# Patient Record
Sex: Female | Born: 1975 | Race: White | Hispanic: No | Marital: Married | Smoking: Current every day smoker
Health system: Southern US, Community
[De-identification: ages and names within clinical notes are randomized; demographics above are authoritative.]

## PROBLEM LIST (undated history)

## (undated) DIAGNOSIS — N7011 Chronic salpingitis: Secondary | ICD-10-CM

## (undated) DIAGNOSIS — N200 Calculus of kidney: Secondary | ICD-10-CM

## (undated) DIAGNOSIS — M549 Dorsalgia, unspecified: Secondary | ICD-10-CM

## (undated) DIAGNOSIS — K219 Gastro-esophageal reflux disease without esophagitis: Secondary | ICD-10-CM

## (undated) DIAGNOSIS — N83201 Unspecified ovarian cyst, right side: Secondary | ICD-10-CM

## (undated) DIAGNOSIS — F419 Anxiety disorder, unspecified: Secondary | ICD-10-CM

## (undated) DIAGNOSIS — A281 Cat-scratch disease: Secondary | ICD-10-CM

## (undated) DIAGNOSIS — IMO0001 Reserved for inherently not codable concepts without codable children: Secondary | ICD-10-CM

## (undated) DIAGNOSIS — F99 Mental disorder, not otherwise specified: Secondary | ICD-10-CM

## (undated) DIAGNOSIS — I10 Essential (primary) hypertension: Secondary | ICD-10-CM

## (undated) DIAGNOSIS — N979 Female infertility, unspecified: Secondary | ICD-10-CM

## (undated) HISTORY — DX: Chronic salpingitis: N70.11

## (undated) HISTORY — PX: ABDOMINAL SURGERY: SHX537

## (undated) HISTORY — DX: Female infertility, unspecified: N97.9

## (undated) HISTORY — PX: LITHOTRIPSY: SUR834

## (undated) HISTORY — DX: Mental disorder, not otherwise specified: F99

## (undated) HISTORY — DX: Unspecified ovarian cyst, right side: N83.201

---

## 2014-09-05 ENCOUNTER — Encounter (HOSPITAL_COMMUNITY): Payer: Self-pay | Admitting: *Deleted

## 2014-09-05 ENCOUNTER — Emergency Department (HOSPITAL_COMMUNITY): Payer: Commercial Managed Care - PPO

## 2014-09-05 ENCOUNTER — Emergency Department (HOSPITAL_COMMUNITY)
Admission: EM | Admit: 2014-09-05 | Discharge: 2014-09-05 | Disposition: A | Discharge status: Home / Self Care | Payer: Commercial Managed Care - PPO | Attending: Emergency Medicine | Admitting: Emergency Medicine

## 2014-09-05 DIAGNOSIS — Z87442 Personal history of urinary calculi: Secondary | ICD-10-CM | POA: Diagnosis not present

## 2014-09-05 DIAGNOSIS — R51 Headache: Secondary | ICD-10-CM | POA: Diagnosis not present

## 2014-09-05 DIAGNOSIS — R072 Precordial pain: Secondary | ICD-10-CM | POA: Insufficient documentation

## 2014-09-05 DIAGNOSIS — Z79899 Other long term (current) drug therapy: Secondary | ICD-10-CM | POA: Insufficient documentation

## 2014-09-05 DIAGNOSIS — H538 Other visual disturbances: Secondary | ICD-10-CM | POA: Diagnosis not present

## 2014-09-05 DIAGNOSIS — F419 Anxiety disorder, unspecified: Secondary | ICD-10-CM | POA: Diagnosis not present

## 2014-09-05 DIAGNOSIS — R110 Nausea: Secondary | ICD-10-CM | POA: Diagnosis not present

## 2014-09-05 DIAGNOSIS — R109 Unspecified abdominal pain: Secondary | ICD-10-CM | POA: Insufficient documentation

## 2014-09-05 DIAGNOSIS — Z791 Long term (current) use of non-steroidal anti-inflammatories (NSAID): Secondary | ICD-10-CM | POA: Insufficient documentation

## 2014-09-05 DIAGNOSIS — R200 Anesthesia of skin: Secondary | ICD-10-CM | POA: Insufficient documentation

## 2014-09-05 DIAGNOSIS — R079 Chest pain, unspecified: Secondary | ICD-10-CM | POA: Diagnosis present

## 2014-09-05 DIAGNOSIS — I10 Essential (primary) hypertension: Secondary | ICD-10-CM | POA: Insufficient documentation

## 2014-09-05 DIAGNOSIS — Z720 Tobacco use: Secondary | ICD-10-CM | POA: Insufficient documentation

## 2014-09-05 DIAGNOSIS — M549 Dorsalgia, unspecified: Secondary | ICD-10-CM | POA: Diagnosis not present

## 2014-09-05 DIAGNOSIS — K219 Gastro-esophageal reflux disease without esophagitis: Secondary | ICD-10-CM | POA: Insufficient documentation

## 2014-09-05 DIAGNOSIS — M7989 Other specified soft tissue disorders: Secondary | ICD-10-CM | POA: Diagnosis not present

## 2014-09-05 DIAGNOSIS — H53149 Visual discomfort, unspecified: Secondary | ICD-10-CM | POA: Diagnosis not present

## 2014-09-05 DIAGNOSIS — R0602 Shortness of breath: Secondary | ICD-10-CM | POA: Diagnosis not present

## 2014-09-05 DIAGNOSIS — Z8619 Personal history of other infectious and parasitic diseases: Secondary | ICD-10-CM | POA: Insufficient documentation

## 2014-09-05 DIAGNOSIS — R519 Headache, unspecified: Secondary | ICD-10-CM

## 2014-09-05 HISTORY — DX: Cat-scratch disease: A28.1

## 2014-09-05 HISTORY — DX: Gastro-esophageal reflux disease without esophagitis: K21.9

## 2014-09-05 HISTORY — DX: Calculus of kidney: N20.0

## 2014-09-05 HISTORY — DX: Dorsalgia, unspecified: M54.9

## 2014-09-05 HISTORY — DX: Essential (primary) hypertension: I10

## 2014-09-05 HISTORY — DX: Anxiety disorder, unspecified: F41.9

## 2014-09-05 HISTORY — DX: Reserved for inherently not codable concepts without codable children: IMO0001

## 2014-09-05 LAB — COMPREHENSIVE METABOLIC PANEL
ALBUMIN: 3.7 g/dL (ref 3.5–5.2)
ALK PHOS: 63 U/L (ref 39–117)
ALT: 14 U/L (ref 0–35)
AST: 19 U/L (ref 0–37)
Anion gap: 7 (ref 5–15)
BUN: 12 mg/dL (ref 6–23)
CO2: 20 mmol/L (ref 19–32)
Calcium: 8.8 mg/dL (ref 8.4–10.5)
Chloride: 111 mmol/L (ref 96–112)
Creatinine, Ser: 0.87 mg/dL (ref 0.50–1.10)
GFR calc non Af Amer: 82 mL/min — ABNORMAL LOW (ref >90)
GLUCOSE: 116 mg/dL — AB (ref 70–99)
POTASSIUM: 3.3 mmol/L — AB (ref 3.5–5.1)
SODIUM: 138 mmol/L (ref 135–145)
Total Bilirubin: 0.2 mg/dL — ABNORMAL LOW (ref 0.3–1.2)
Total Protein: 7 g/dL (ref 6.0–8.3)

## 2014-09-05 LAB — CBC WITH DIFFERENTIAL/PLATELET
Basophils Absolute: 0 10*3/uL (ref 0.0–0.1)
Basophils Relative: 0 % (ref 0–1)
EOS ABS: 0.1 10*3/uL (ref 0.0–0.7)
EOS PCT: 1 % (ref 0–5)
HCT: 37.8 % (ref 36.0–46.0)
Hemoglobin: 12.8 g/dL (ref 12.0–15.0)
Lymphocytes Relative: 31 % (ref 12–46)
Lymphs Abs: 3.4 10*3/uL (ref 0.7–4.0)
MCH: 28.6 pg (ref 26.0–34.0)
MCHC: 33.9 g/dL (ref 30.0–36.0)
MCV: 84.4 fL (ref 78.0–100.0)
MONO ABS: 0.6 10*3/uL (ref 0.1–1.0)
Monocytes Relative: 6 % (ref 3–12)
Neutro Abs: 6.8 10*3/uL (ref 1.7–7.7)
Neutrophils Relative %: 62 % (ref 43–77)
PLATELETS: 392 10*3/uL (ref 150–400)
RBC: 4.48 MIL/uL (ref 3.87–5.11)
RDW: 13.7 % (ref 11.5–15.5)
WBC: 10.9 10*3/uL — ABNORMAL HIGH (ref 4.0–10.5)

## 2014-09-05 LAB — TROPONIN I
Troponin I: <0.03 ng/mL (ref <0.031)
Troponin I: <0.03 ng/mL (ref <0.031)

## 2014-09-05 LAB — D-DIMER, QUANTITATIVE: D-Dimer, Quant: 0.27 ug/mL-FEU (ref 0.00–0.48)

## 2014-09-05 MED ORDER — SODIUM CHLORIDE 0.9 % IV BOLUS (SEPSIS)
500.0000 mL | Freq: Once | INTRAVENOUS | Status: AC
  Administered 2014-09-05: 500 mL via INTRAVENOUS

## 2014-09-05 MED ORDER — HYDROCODONE-ACETAMINOPHEN 5-325 MG PO TABS: 1.0000 | ORAL_TABLET | Freq: Four times a day (QID) | ORAL | Status: DC | PRN

## 2014-09-05 MED ORDER — ASPIRIN 81 MG PO CHEW
324.0000 mg | CHEWABLE_TABLET | Freq: Once | ORAL | Status: AC
  Administered 2014-09-05: 324 mg via ORAL
  Filled 2014-09-05: qty 4

## 2014-09-05 MED ORDER — ONDANSETRON HCL 4 MG/2ML IJ SOLN
4.0000 mg | Freq: Once | INTRAMUSCULAR | Status: AC
  Administered 2014-09-05: 4 mg via INTRAVENOUS
  Filled 2014-09-05: qty 2

## 2014-09-05 MED ORDER — SODIUM CHLORIDE 0.9 % IV SOLN
INTRAVENOUS | Status: DC
  Administered 2014-09-05: 20:00:00 via INTRAVENOUS

## 2014-09-05 MED ORDER — HYDROMORPHONE HCL 1 MG/ML IJ SOLN
1.0000 mg | Freq: Once | INTRAMUSCULAR | Status: AC
  Administered 2014-09-05: 1 mg via INTRAVENOUS
  Filled 2014-09-05: qty 1

## 2014-09-05 NOTE — ED Notes (Signed)
Chest pain, with pain lt arm , headache, numbness lt side of face at times.  Onset this am.

## 2014-09-05 NOTE — Discharge Instructions (Signed)
Workup for the headache flank pain and chest pain all negative. Return for any new or worse symptoms. Take pain medicine as directed. Resource guide provided below as needed.   Emergency Department Resource Guide 1) Find a Doctor and Pay Out of Pocket Although you won't have to find out who is covered by your insurance plan, it is a good idea to ask around and get recommendations. You will then need to call the office and see if the doctor you have chosen will accept you as a new patient and what types of options they offer for patients who are self-pay. Some doctors offer discounts or will set up payment plans for their patients who do not have insurance, but you will need to ask so you aren't surprised when you get to your appointment.  2) Contact Your Local Health Department Not all health departments have doctors that can see patients for sick visits, but many do, so it is worth a call to see if yours does. If you don't know where your local health department is, you can check in your phone book. The CDC also has a tool to help you locate your state's health department, and many state websites also have listings of all of their local health departments.  3) Find a [facility] If your illness is not likely to be very severe or complicated, you may want to try a walk in clinic. These are popping up all over the country in pharmacies, drugstores, and shopping centers. They're usually staffed by nurse practitioners or physician assistants that have been trained to treat common illnesses and complaints. They're usually fairly quick and inexpensive. However, if you have serious medical issues or chronic medical problems, these are probably not your best option.  No Primary Care Doctor: - Call Health Connect at  [phone] - they can help you locate a primary care doctor that  accepts your insurance, provides certain services, etc. - Physician Referral Service- [phone]  Chronic Pain  Problems: Organization         Address  Phone   Notes  [facility]  [phone] Patients need to be referred by their primary care doctor.   Medication Assistance: Organization         Address  Phone   Notes  [facility] Medication [facility] [address] [place], [place] [address] [phone] --Must be a resident of [facility] -- Must have NO insurance coverage whatsoever (no Medicaid/ Medicare, etc.) -- The pt. MUST have a primary care doctor that directs their care regularly and follows them in the community   MedAssist  [phone]   [employer]  [phone]    Agencies that provide inexpensive medical care: Organization         Address  Phone   Notes  [place]  [phone]   [facility]    [phone]   [facility] [place], [place] [address] [phone]   [place] [address] [place] [phone]   Planned Parenthood    [phone]   [facility]    [phone]   [place] and [place], [place] Phone:  [phone], Fax:  [phone] Hours of Operation:  9 am - 6 pm, M-F.  Also accepts Medicaid/Medicare and self-pay.  [facility] [address]  [place], [place], [place] Phone: [phone], Fax: [phone]. Hours of Operation:  8:30 am - 5:30 pm, M-F.  Also accepts Medicaid and self-pay.  [facility] [address], [place] Phone: [phone]   [place], [place], [place] [phone], Ext. [phone] Mondays & Thursdays: 7-9 AM.  First 15 patients are seen on a first come, first serve basis.    [place] Providers:  Organization         Address  Phone   Notes  [facility] [address], [place] [phone] Also  accepts self-pay patients.  [facility] [address], [place]  [phone]   [place], [address], [place] [phone]   [facility] [address] [place] [phone]   Lucianne Lei [address], [place]   [phone] Only accepts [place] Access [place] patients after they have their name applied to their card.   Self-Pay (no insurance) in [facility]:  Organization         Address  Phone   Notes  Sickle Cell Patients, [facility] [place] [phone]   [facility] [place] [phone]   [facility] [place]  [place], [place],  [phone]   [facility]/Dr. Osei-Bonsu  [address] [place] or [address], [place] [phone] Phone number for both [place] and [place] locations is the same.  Urgent Medical and [facility] [address], [place] [phone]   [facility] [address] [place] or [address] [phone]   [facility] [address], [place] [phone], phone; [phone], fax Sees patients 1st and 3rd Saturday of every month.  Must not qualify for public or private insurance (i.e. Medicaid, Medicare, [place] Health Choice, Veterans' Benefits)  Household income should be no more than 200% of the poverty level The clinic cannot treat you if you are pregnant or think you are pregnant  Sexually transmitted diseases are not treated at the clinic.    Dental Care: Organization         Address  Phone  Notes  [facility] Department of [facility] [place] [phone] Accepts children up to age 34 who are enrolled in [place] or [place]; pregnant  women with a Medicaid card; and children who have applied for Medicaid or [place] Health Choice, but were declined, whose parents can pay a reduced fee at time of service.  [facility] Department of [facility]  [address], [place] [phone] Accepts children up to age 24 who are enrolled in [place] or [place]; pregnant women with a Medicaid card; and children who have applied for Medicaid or [place] Health Choice, but were declined, whose parents can pay a reduced fee at time of service.  [facility] Access PROGRAM  [place] [phone] Patients are seen by appointment only. Walk-ins are not accepted. [place] will see patients 57 years of age and older. Monday - Tuesday (8am-5pm) Most Wednesdays (8:30-5pm) $30 per visit, cash only  [place]  [address], [place] [phone] Patients are seen by appointment only. Walk-ins are not accepted. [place] will see patients 47 years of age and older. One Wednesday Evening (Monthly: Volunteer Based).  $30 per visit, cash only  [place]  [phone] for adults; Children under age 20, call Graduate Pediatric Dentistry at [phone]. Children aged 75-14, please call [phone] to request a pediatric application.  Dental services are provided in all areas of dental care including fillings, crowns and bridges, complete and partial dentures, implants, gum treatment, root canals, and extractions. Preventive care is also provided. Treatment is provided to both adults and children. Patients are selected via a lottery and there is often a waiting list.   [facility] [address], [place]  [phone] [email or web address]   [facility] [address] [place], [place] [phone], Ext. [phone] Second and Fourth Thursday of each month, opens at 6:30 AM; Clinic ends at 9 AM.  Patients are  seen on a first-come first-served basis, and a limited number are seen during each clinic.   [facility]  [address] Hillard Danker [place], [place] [phone]   Eligibility Requirements You must have lived in [place], [place], or [place] counties for at least the last three months.   You cannot be eligible for state or federal sponsored [employer], including [employer], [place], or [employer].   You generally cannot be eligible for healthcare insurance through your employer.    How to apply: Eligibility screenings are held every Tuesday and Wednesday afternoon from 1:00 pm until 4:00 pm. You do not need an appointment for the interview!  [facility] [address] [place], [place]   [place]  [place] Department  [place]  [phone]    Behavioral Health Resources in the Community: Intensive Outpatient Programs Organization         Address  Phone  Notes  [place]. [address] [place], [place] [phone]   [facility] [address] [place], [place]   ADS: [facility] [address] [place], [place]   [place] [address]  [place], [place] or [phone]   Substance Abuse Resources Organization         Address  Phone  Notes  Alcohol and Drug Services  [phone]   [place]  [phone]   [facility] [place]   Chinita Pester  [phone]   Residential & Outpatient Substance Abuse Program  [phone]   Psychological Services Organization         Address  Phone  Notes  [facility] [place]  [place]  [phone]   [place] [address] [place] or [phone]    Mobile Crisis  Teams Organization         Address  Phone  Notes  [facility], Mobile Crisis Care Unit  [phone]   [facility]  [address] [place], [place]   [place] [address] [place] [phone]    Self-Help/Support Groups Organization         Address  Phone             Notes  [place]. of [place] -  variety of support groups  [phone]- [phone] Call for more information  Narcotics Anonymous (NA), [facility] [address], [employer] [place]  2 meetings at this location   Residential Facilities manager         Address  Phone  Notes  [facility] [place],    [place]  [phone]   [facility]  [address] [place] 623762, [place], [place]   [place], [place] [phone] Admissions: 8am-3pm M-F  Incentives Substance [place] [address],    [place], [place] [phone]   [facility] [address] [place], [place], [place]   [facility] [address]  [place], [place]   [facility]ight Programs - Intensive Outpatient [address]een [address], [place], [place]   [facility] [place] ([place] [address].,  [place], [place] [phone] or [phone]   [facility] Treatment Services (RTS) [address] [place], [place] Accepts Medicaid  [facility] [place] [address],  [place] [phone] Substance Abuse/Addiction Treatment   [facility] Organization         Address  Phone  Notes  [facility]  [phone]   Domenic Schwab, PhD [address] [place], [place]   [phone] or [phone]   [place], [place] [phone]   [facility] [address] [place], [place] [phone]  Insurance/Medicaid/sponsorship through [employer] [facility] [employer] [facility] [employer] and Families [address]                                    [place], [place] [phone] [place] [address], [place] [phone]    Dr. Adele Schilder  [phone]   [facility] Dept. 1) [address] [place] 2) [place] 3)  [place] [address], [place] [phone]  [phone]   [place] [phone] or [phone] (After Hours)

## 2014-09-05 NOTE — ED Notes (Addendum)
Called pt x4 times for room. No answer in waiting room. Charge RN aware.

## 2014-09-05 NOTE — ED Notes (Signed)
Pt requesting something to eat and drink.

## 2014-09-05 NOTE — ED Provider Notes (Signed)
CSN: [id number]     Arrival date & time 09/05/14  1319 History  This chart was scribed for Megan Sorrow, MD by Molli Posey, ED Scribe. This patient was seen in room APA06/APA06 and the patient's care was started 7:27 PM.    Chief Complaint  Patient presents with  . Chest Pain   Patient is a 39 y.o. female presenting with chest pain. The history is provided by the patient. No language interpreter was used.  Chest Pain Pain location:  Substernal area Pain radiates to:  L arm Pain radiates to the back: yes   Pain severity:  Moderate Onset quality:  Sudden Duration:  9 hours Progression:  Waxing and waning Chronicity:  New Worsened by:  Exertion Associated symptoms: back pain, dizziness, headache, nausea, numbness and shortness of breath   Associated symptoms: no abdominal pain, no cough, no fever and not vomiting    HPI Comments: Megan Sweeney is a 39 y.o. female with a history of kindey stones, HTN and anxiety who presents to the Emergency Department complaining of substernal, chest pain that started at 11AM this morning. Pt rates her CP as a 8/10 at this time. Pt states that the pain radiates to her left arm and she is experiencing a tingling sensation on the left side of her face. She reports that her pain worsens with walking and reports associated SOB, nausea and HA. She states that her worst pain is her HA currently. Pt reports no similar prior episodes. She reports that her mother died at age 25 due to a heart problem. She denies body aches.   Pt also complains of left sided back pain and says she thinks that she has a kidney stone.   Past Medical History  Diagnosis Date  . Kidney stones   . Hypertension   . Anxiety   . Reflux   . Back pain   . Cat scratch fever    Past Surgical History  Procedure Laterality Date  . Lithotripsy    . Abdominal surgery     History reviewed. No pertinent family history. History  Substance Use Topics  . Smoking status: Current  Every Day Smoker  . Smokeless tobacco: Not on file  . Alcohol Use: No   OB History    No data available     Review of Systems  Constitutional: Negative for fever and chills.  HENT: Negative for rhinorrhea and sore throat.   Eyes: Positive for photophobia and visual disturbance.  Respiratory: Positive for shortness of breath. Negative for cough.   Cardiovascular: Positive for chest pain and leg swelling.  Gastrointestinal: Positive for nausea. Negative for vomiting, abdominal pain and diarrhea.  Genitourinary: Negative for dysuria.  Musculoskeletal: Positive for back pain. Negative for myalgias and neck pain.  Skin: Negative for rash.  Neurological: Positive for dizziness, light-headedness, numbness and headaches.  Hematological: Does not bruise/bleed easily.  Psychiatric/Behavioral: Negative for confusion.      Allergies  Sulfa antibiotics  Home Medications   Prior to Admission medications   Medication Sig Start Date End Date Taking? Authorizing Provider  acetaminophen (TYLENOL) 500 MG tablet Take 500 mg by mouth every 4 (four) hours as needed for mild pain or moderate pain.   Yes Historical Provider, MD  atenolol (TENORMIN) 25 MG tablet Take 25 mg by mouth daily.   Yes Historical Provider, MD  buPROPion (WELLBUTRIN XL) 150 MG 24 hr tablet Take 150 mg by mouth daily.   Yes Historical Provider, MD  escitalopram (LEXAPRO) 20  MG tablet Take 20 mg by mouth daily.   Yes Historical Provider, MD  lisinopril (PRINIVIL,ZESTRIL) 10 MG tablet Take 10 mg by mouth daily.   Yes Historical Provider, MD  naproxen sodium (ANAPROX) 220 MG tablet Take 220 mg by mouth daily as needed (for pain).   Yes Historical Provider, MD  Omega-3 Fatty Acids (FISH OIL BURP-LESS PO) Take 1 capsule by mouth daily.   Yes Historical Provider, MD  pantoprazole (PROTONIX) 40 MG tablet Take 40 mg by mouth daily.   Yes Historical Provider, MD  simvastatin (ZOCOR) 20 MG tablet Take 20 mg by mouth daily.   Yes  Historical Provider, MD  fluconazole (DIFLUCAN) 150 MG tablet Take 150 mg by mouth once. Starting on 08/25/14 take one tablet, then repeat in 3 days    Historical Provider, MD  HYDROcodone-acetaminophen (NORCO/VICODIN) 5-325 MG per tablet Take 1-2 tablets by mouth every 6 (six) hours as needed. 09/05/14   Megan Sorrow, MD   BP 123/98 mmHg  Pulse 78  Temp(Src) 98.4 F (36.9 C) (Oral)  Resp 20  Ht 5\' 10"  (1.778 m)  Wt 248 lb (112.492 kg)  BMI 35.58 kg/m2  SpO2 98%  LMP 09/02/2014 Physical Exam  Constitutional: She is oriented to person, place, and time. She appears well-developed and well-nourished.  HENT:  Head: Normocephalic and atraumatic.  Mouth/Throat: Oropharynx is clear and moist.  Eyes: EOM are normal. Pupils are equal, round, and reactive to light. Right eye exhibits no discharge. Left eye exhibits no discharge. No scleral icterus.  Neck: Neck supple. No tracheal deviation present.  Cardiovascular: Normal rate, regular rhythm and normal heart sounds.   Pulmonary/Chest: Effort normal and breath sounds normal. No respiratory distress. She has no wheezes. She has no rales.  Abdominal: Soft. Bowel sounds are normal. She exhibits no distension. There is no tenderness.  Musculoskeletal:  No swelling in the ankles.   Neurological: She is alert and oriented to person, place, and time.  Skin: Skin is warm and dry.  Psychiatric: She has a normal mood and affect. Her behavior is normal.  Nursing note and vitals reviewed.   ED Course  Procedures   DIAGNOSTIC STUDIES: Oxygen Saturation is 97% on RA, normal by my interpretation.    COORDINATION OF CARE: 7:36 PM Discussed treatment plan with pt at bedside and pt agreed to plan.   Labs Review Labs Reviewed  CBC WITH DIFFERENTIAL/PLATELET - Abnormal; Notable for the following:    WBC 10.9 (*)    All other components within normal limits  COMPREHENSIVE METABOLIC PANEL - Abnormal; Notable for the following:    Potassium 3.3 (*)     Glucose, Bld 116 (*)    Total Bilirubin 0.2 (*)    GFR calc non Af Amer 82 (*)    All other components within normal limits  TROPONIN I  D-DIMER, QUANTITATIVE  TROPONIN I    Imaging Review Dg Chest 2 View  09/05/2014   CLINICAL DATA:  Chest pain, nausea, left face tingling  EXAM: CHEST  2 VIEW  COMPARISON:  None.  FINDINGS: Cardiomediastinal silhouette is unremarkable. No acute infiltrate or pleural effusion. No pulmonary edema. Bony thorax is unremarkable.  IMPRESSION: No active cardiopulmonary disease.   Electronically Signed   By: Lahoma Crocker M.D.   On: 09/05/2014 14:33   Ct Head Wo Contrast  09/05/2014   CLINICAL DATA:  Headaches today.  Tingling to the left side of face.  EXAM: CT HEAD WITHOUT CONTRAST  TECHNIQUE: Contiguous axial images were  obtained from the base of the skull through the vertex without intravenous contrast.  COMPARISON:  None.  FINDINGS: There is no midline shift or hydrocephalus. No acute hemorrhage or acute transcortical infarct is identified. 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 The bony calvarium is intact. The visualized sinuses are clear.  IMPRESSION: No acute abnormality identified.   Electronically Signed   By: Abelardo Diesel M.D.   On: 09/05/2014 20:37   Ct Renal Stone Study  09/05/2014   CLINICAL DATA:  Bilateral flank pain and history of renal calculi.  EXAM: CT ABDOMEN AND PELVIS WITHOUT CONTRAST  TECHNIQUE: Multidetector CT imaging of the abdomen and pelvis was performed following the standard protocol without IV contrast.  COMPARISON:  None.  FINDINGS: Nonobstructing calculus in the lower pole of the left kidney measures approximately 6 mm in greatest diameter. There may be 1 or 2 other punctate calculi in the midportion of the left kidney. Punctate calculus present in the mid right kidney. No hydronephrosis bilaterally. No ureteral or bladder calculi identified.  Unenhanced appearance of the liver, gallbladder, pancreas,  spleen, adrenal glands and bowel are unremarkable. No abnormal fluid collections. No hernias, masses or enlarged lymph nodes are seen.  The uterus and adnexal regions are unremarkable by CT. No bony abnormalities are seen. Incidental transitional S1 level.  IMPRESSION: 6 mm lower pole calculus of the left kidney and other bilateral punctate calculi. No evidence of hydronephrosis. No ureteral or bladder calculi identified.   Electronically Signed   By: Aletta Edouard M.D.   On: 09/05/2014 20:35     EKG Interpretation   Date/Time:  Monday September 05 2014 13:24:46 EST Ventricular Rate:  89 PR Interval:  164 QRS Duration: 76 QT Interval:  362 QTC Calculation: 440 R Axis:   62 Text Interpretation:  Normal sinus rhythm Normal ECG No previous ECGs  available Confirmed by Harvey Matlack  MD, Tashiba Timoney ([id number]) on 09/05/2014 7:26:06 PM      MDM   Final diagnoses:  Headache  Flank pain  Chest pain, unspecified chest pain type   Patient with onset of multiple symptoms at 3:00 in the afternoon. Included shortness of breath substernal chest pain headache left-sided flank pain. May very well be viral cause. Workup for chest pain negative troponins 2. Unlikely to be cardiac EKG without acute changes. CT of the abdomen shows no evidence of any ureteral stones or abnormalities. CT of head was negative chest x-ray negative for pneumonia or pneumothorax. Suspect symptoms may be a viral illness. Patient improved here with pain medicine. Will discharge home with close follow-up and precautions.  I personally performed the services described in this documentation, which was scribed in my presence. The recorded information has been reviewed and is accurate.       Megan Sorrow, MD 09/05/14 2248

## 2015-05-22 ENCOUNTER — Encounter (HOSPITAL_COMMUNITY): Payer: Self-pay | Admitting: Emergency Medicine

## 2015-05-22 ENCOUNTER — Emergency Department (HOSPITAL_COMMUNITY)
Admission: EM | Admit: 2015-05-22 | Discharge: 2015-05-22 | Disposition: A | Discharge status: Home / Self Care | Payer: Commercial Managed Care - PPO | Attending: Emergency Medicine | Admitting: Emergency Medicine

## 2015-05-22 ENCOUNTER — Emergency Department (HOSPITAL_COMMUNITY): Payer: Commercial Managed Care - PPO

## 2015-05-22 DIAGNOSIS — F419 Anxiety disorder, unspecified: Secondary | ICD-10-CM | POA: Diagnosis not present

## 2015-05-22 DIAGNOSIS — M545 Low back pain, unspecified: Secondary | ICD-10-CM

## 2015-05-22 DIAGNOSIS — I10 Essential (primary) hypertension: Secondary | ICD-10-CM | POA: Diagnosis not present

## 2015-05-22 DIAGNOSIS — F17210 Nicotine dependence, cigarettes, uncomplicated: Secondary | ICD-10-CM | POA: Insufficient documentation

## 2015-05-22 DIAGNOSIS — R112 Nausea with vomiting, unspecified: Secondary | ICD-10-CM | POA: Diagnosis not present

## 2015-05-22 DIAGNOSIS — Z87442 Personal history of urinary calculi: Secondary | ICD-10-CM | POA: Insufficient documentation

## 2015-05-22 DIAGNOSIS — K219 Gastro-esophageal reflux disease without esophagitis: Secondary | ICD-10-CM | POA: Diagnosis not present

## 2015-05-22 DIAGNOSIS — Z79899 Other long term (current) drug therapy: Secondary | ICD-10-CM | POA: Insufficient documentation

## 2015-05-22 DIAGNOSIS — R109 Unspecified abdominal pain: Secondary | ICD-10-CM | POA: Diagnosis not present

## 2015-05-22 DIAGNOSIS — R509 Fever, unspecified: Secondary | ICD-10-CM | POA: Diagnosis not present

## 2015-05-22 DIAGNOSIS — R300 Dysuria: Secondary | ICD-10-CM | POA: Diagnosis not present

## 2015-05-22 DIAGNOSIS — Z3202 Encounter for pregnancy test, result negative: Secondary | ICD-10-CM | POA: Diagnosis not present

## 2015-05-22 LAB — BASIC METABOLIC PANEL
ANION GAP: 6 (ref 5–15)
BUN: 9 mg/dL (ref 6–20)
CO2: 23 mmol/L (ref 22–32)
Calcium: 9.1 mg/dL (ref 8.9–10.3)
Chloride: 108 mmol/L (ref 101–111)
Creatinine, Ser: 0.75 mg/dL (ref 0.44–1.00)
GFR calc Af Amer: >60 mL/min (ref >60)
GFR calc non Af Amer: >60 mL/min (ref >60)
GLUCOSE: 137 mg/dL — AB (ref 65–99)
POTASSIUM: 3.7 mmol/L (ref 3.5–5.1)
Sodium: 137 mmol/L (ref 135–145)

## 2015-05-22 LAB — CBC
HCT: 38.8 % (ref 36.0–46.0)
HEMOGLOBIN: 13.3 g/dL (ref 12.0–15.0)
MCH: 28.9 pg (ref 26.0–34.0)
MCHC: 34.3 g/dL (ref 30.0–36.0)
MCV: 84.2 fL (ref 78.0–100.0)
Platelets: 323 10*3/uL (ref 150–400)
RBC: 4.61 MIL/uL (ref 3.87–5.11)
RDW: 13.5 % (ref 11.5–15.5)
WBC: 8.6 10*3/uL (ref 4.0–10.5)

## 2015-05-22 LAB — URINALYSIS, ROUTINE W REFLEX MICROSCOPIC
Bilirubin Urine: NEGATIVE
Glucose, UA: NEGATIVE mg/dL
Ketones, ur: NEGATIVE mg/dL
LEUKOCYTES UA: NEGATIVE
Nitrite: NEGATIVE
Protein, ur: NEGATIVE mg/dL
SPECIFIC GRAVITY, URINE: 1.01 (ref 1.005–1.030)
pH: 6 (ref 5.0–8.0)

## 2015-05-22 LAB — URINE MICROSCOPIC-ADD ON: Bacteria, UA: NONE SEEN

## 2015-05-22 LAB — PREGNANCY, URINE: Preg Test, Ur: NEGATIVE

## 2015-05-22 MED ORDER — NAPROXEN 500 MG PO TABS: 500.0000 mg | ORAL_TABLET | Freq: Two times a day (BID) | ORAL | Status: DC

## 2015-05-22 MED ORDER — HYDROCODONE-ACETAMINOPHEN 5-325 MG PO TABS: 1.0000 | ORAL_TABLET | Freq: Four times a day (QID) | ORAL | Status: DC | PRN

## 2015-05-22 MED ORDER — ONDANSETRON HCL 4 MG/2ML IJ SOLN
4.0000 mg | Freq: Once | INTRAMUSCULAR | Status: AC
  Administered 2015-05-22: 4 mg via INTRAVENOUS
  Filled 2015-05-22: qty 2

## 2015-05-22 MED ORDER — FENTANYL CITRATE (PF) 100 MCG/2ML IJ SOLN
50.0000 ug | Freq: Once | INTRAMUSCULAR | Status: AC
  Administered 2015-05-22: 50 ug via INTRAVENOUS
  Filled 2015-05-22: qty 2

## 2015-05-22 MED ORDER — PROMETHAZINE HCL 25 MG PO TABS: 25.0000 mg | ORAL_TABLET | Freq: Four times a day (QID) | ORAL | Status: AC | PRN

## 2015-05-22 MED ORDER — SODIUM CHLORIDE 0.9 % IV SOLN
INTRAVENOUS | Status: DC
  Administered 2015-05-22: 14:00:00 via INTRAVENOUS

## 2015-05-22 MED ORDER — SODIUM CHLORIDE 0.9 % IV BOLUS (SEPSIS)
1000.0000 mL | Freq: Once | INTRAVENOUS | Status: AC
  Administered 2015-05-22: 1000 mL via INTRAVENOUS

## 2015-05-22 MED ORDER — CYCLOBENZAPRINE HCL 10 MG PO TABS: 10.0000 mg | ORAL_TABLET | Freq: Two times a day (BID) | ORAL | Status: DC | PRN

## 2015-05-22 NOTE — Discharge Instructions (Signed)
Medications as directed. Would recommend following up with urology. No evidence of any kidney stones in your ureter today. Urinalysis was not consistently urinary tract infection.

## 2015-05-22 NOTE — ED Provider Notes (Signed)
CSN: [id number]     Arrival date & time 05/22/15  1135 History   First MD Initiated Contact with Patient 05/22/15 1303     Chief Complaint  Patient presents with  . Flank Pain    right and left.     (Consider location/radiation/quality/duration/timing/severity/associated sxs/prior Treatment) The history is provided by the patient.   39 year old female with complaint of bilateral low back pain and flank pain. Patient was told by her family doctor a week ago that she had kidney stones was given referral to urology. Patient states pain is 8 out of 10. Patient states she having difficulty voiding. And also has some burning when she voids. Intermittent fever nausea and vomiting. No anterior abdominal pain. No vaginal discharge. Patient with history of kidney stones in the past.  Past Medical History  Diagnosis Date  . Kidney stones   . Hypertension   . Anxiety   . Reflux   . Back pain   . Cat scratch fever    Past Surgical History  Procedure Laterality Date  . Lithotripsy    . Abdominal surgery     History reviewed. No pertinent family history. Social History  Substance Use Topics  . Smoking status: Current Every Day Smoker -- 0.50 packs/day    Types: Cigarettes  . Smokeless tobacco: None  . Alcohol Use: No   OB History    No data available     Review of Systems  Constitutional: Positive for fever.  HENT: Negative for congestion.   Respiratory: Negative for shortness of breath.   Cardiovascular: Negative for chest pain.  Gastrointestinal: Positive for nausea and vomiting.  Genitourinary: Positive for dysuria, flank pain and decreased urine volume.  Musculoskeletal: Positive for back pain. Negative for neck pain.  Skin: Negative for rash.  Neurological: Negative for headaches.  Hematological: Does not bruise/bleed easily.  Psychiatric/Behavioral: Negative for confusion.      Allergies  Sulfa antibiotics  Home Medications   Prior to Admission medications    Medication Sig Start Date End Date Taking? Authorizing Provider  acetaminophen (TYLENOL) 500 MG tablet Take 500 mg by mouth every 4 (four) hours as needed for mild pain or moderate pain.   Yes Historical Provider, MD  ALPRAZolam Duanne Moron) 1 MG tablet Take 1 mg by mouth at bedtime as needed for anxiety.   Yes Historical Provider, MD  atenolol (TENORMIN) 25 MG tablet Take 25 mg by mouth daily.   Yes Historical Provider, MD  escitalopram (LEXAPRO) 20 MG tablet Take 20 mg by mouth daily.   Yes Historical Provider, MD  HYDROcodone-acetaminophen (NORCO/VICODIN) 5-325 MG per tablet Take 1-2 tablets by mouth every 6 (six) hours as needed. 09/05/14  Yes Fredia Sorrow, MD  ibuprofen (ADVIL,MOTRIN) 200 MG tablet Take 800 mg by mouth every 6 (six) hours as needed for moderate pain.   Yes Historical Provider, MD  lisinopril (PRINIVIL,ZESTRIL) 10 MG tablet Take 10 mg by mouth daily.   Yes Historical Provider, MD  Omega-3 Fatty Acids (FISH OIL BURP-LESS PO) Take 1 capsule by mouth daily.   Yes Historical Provider, MD  pantoprazole (PROTONIX) 40 MG tablet Take 40 mg by mouth daily.   Yes Historical Provider, MD  simvastatin (ZOCOR) 20 MG tablet Take 20 mg by mouth daily.   Yes Historical Provider, MD  buPROPion (WELLBUTRIN XL) 150 MG 24 hr tablet Take 150 mg by mouth daily.    Historical Provider, MD  cyclobenzaprine (FLEXERIL) 10 MG tablet Take 1 tablet (10 mg total) by mouth 2 (  two) times daily as needed for muscle spasms. 05/22/15   Fredia Sorrow, MD  fluconazole (DIFLUCAN) 150 MG tablet Take 150 mg by mouth once. Starting on 08/25/14 take one tablet, then repeat in 3 days    Historical Provider, MD  HYDROcodone-acetaminophen (NORCO/VICODIN) 5-325 MG tablet Take 1-2 tablets by mouth every 6 (six) hours as needed. 05/22/15   Fredia Sorrow, MD  naproxen (NAPROSYN) 500 MG tablet Take 1 tablet (500 mg total) by mouth 2 (two) times daily. 05/22/15   Fredia Sorrow, MD  naproxen sodium (ANAPROX) 220 MG tablet Take  220 mg by mouth daily as needed (for pain).    Historical Provider, MD  promethazine (PHENERGAN) 25 MG tablet Take 1 tablet (25 mg total) by mouth every 6 (six) hours as needed. 05/22/15   Fredia Sorrow, MD   BP 122/108 mmHg  Pulse 91  Temp(Src) 97.5 F (36.4 C) (Oral)  Resp 18  Ht 5\' 10"  (1.778 m)  Wt 85.73 kg  BMI 27.12 kg/m2  SpO2 100%  LMP 04/25/2015 Physical Exam  Constitutional: She is oriented to person, place, and time. She appears well-developed and well-nourished. No distress.  HENT:  Head: Normocephalic and atraumatic.  Mouth/Throat: Oropharynx is clear and moist.  Eyes: Conjunctivae and EOM are normal. Pupils are equal, round, and reactive to light.  Neck: Normal range of motion. Neck supple.  Cardiovascular: Normal rate, regular rhythm and normal heart sounds.   No murmur heard. Pulmonary/Chest: Effort normal and breath sounds normal. No respiratory distress.  Abdominal: Soft. Bowel sounds are normal. There is no tenderness.  Musculoskeletal: Normal range of motion.  Neurological: She is alert and oriented to person, place, and time. No cranial nerve deficit. She exhibits normal muscle tone. Coordination normal.  Skin: Skin is warm. No rash noted.  Nursing note and vitals reviewed.   ED Course  Procedures (including critical care time) Labs Review Labs Reviewed  BASIC METABOLIC PANEL - Abnormal; Notable for the following:    Glucose, Bld 137 (*)    All other components within normal limits  URINALYSIS, ROUTINE W REFLEX MICROSCOPIC (NOT AT [facility]) - Abnormal; Notable for the following:    Hgb urine dipstick TRACE (*)    All other components within normal limits  URINE MICROSCOPIC-ADD ON - Abnormal; Notable for the following:    Squamous Epithelial / LPF 6-30 (*)    All other components within normal limits  CBC  PREGNANCY, URINE   Results for orders placed or performed during the hospital encounter of [id number]  Basic metabolic panel  Result Value Ref Range    Sodium 137 135 - 145 mmol/L   Potassium 3.7 3.5 - 5.1 mmol/L   Chloride 108 101 - 111 mmol/L   CO2 23 22 - 32 mmol/L   Glucose, Bld 137 (H) 65 - 99 mg/dL   BUN 9 6 - 20 mg/dL   Creatinine, Ser 0.75 0.44 - 1.00 mg/dL   Calcium 9.1 8.9 - 10.3 mg/dL   GFR calc non Af Amer >60 >60 mL/min   GFR calc Af Amer >60 >60 mL/min   Anion gap 6 5 - 15  CBC  Result Value Ref Range   WBC 8.6 4.0 - 10.5 K/uL   RBC 4.61 3.87 - 5.11 MIL/uL   Hemoglobin 13.3 12.0 - 15.0 g/dL   HCT 38.8 36.0 - 46.0 %   MCV 84.2 78.0 - 100.0 fL   MCH 28.9 26.0 - 34.0 pg   MCHC 34.3 30.0 - 36.0 g/dL  RDW 13.5 11.5 - 15.5 %   Platelets 323 150 - 400 K/uL  Pregnancy, urine  Result Value Ref Range   Preg Test, Ur NEGATIVE NEGATIVE  Urinalysis, Routine w reflex microscopic (not at [facility])  Result Value Ref Range   Color, Urine YELLOW YELLOW   APPearance CLEAR CLEAR   Specific Gravity, Urine 1.010 1.005 - 1.030   pH 6.0 5.0 - 8.0   Glucose, UA NEGATIVE NEGATIVE mg/dL   Hgb urine dipstick TRACE (A) NEGATIVE   Bilirubin Urine NEGATIVE NEGATIVE   Ketones, ur NEGATIVE NEGATIVE mg/dL   Protein, ur NEGATIVE NEGATIVE mg/dL   Nitrite NEGATIVE NEGATIVE   Leukocytes, UA NEGATIVE NEGATIVE  Urine microscopic-add on  Result Value Ref Range   Squamous Epithelial / LPF 6-30 (A) NONE SEEN   WBC, UA 0-5 0 - 5 WBC/hpf   RBC / HPF 0-5 0 - 5 RBC/hpf   Bacteria, UA NONE SEEN NONE SEEN   Urine-Other YEAST PRESENT      Imaging Review Ct Renal Stone Study  05/22/2015  CLINICAL DATA:  39 year old female with bilateral flank pain for the past 2 to 2-1/2 weeks. History of kidney stones. EXAM: CT ABDOMEN AND PELVIS WITHOUT CONTRAST TECHNIQUE: Multidetector CT imaging of the abdomen and pelvis was performed following the standard protocol without IV contrast. COMPARISON:  CT the abdomen and pelvis 09/05/2014. FINDINGS: Lower chest: Mild scarring in the periphery of the right lower lobe. Hepatobiliary: Low attenuation throughout the  hepatic parenchyma, compatible with hepatic steatosis. No discrete cystic or solid hepatic lesions are confidently identified on today's noncontrast CT examination. The unenhanced appearance of the gallbladder is normal. Pancreas: No pancreatic mass or peripancreatic inflammatory changes on today's noncontrast CT examination. Spleen: Unremarkable. Adrenals/Urinary Tract: 2 mm nonobstructive calculus in the interpolar collecting system of the right kidney. 8 mm nonobstructive calculus in the lower pole collecting system of the left kidney. No additional calculi are identified along the course of either ureter or within the lumen of the urinary bladder. No hydroureteronephrosis to indicate urinary tract obstruction at this time. The unenhanced appearance of the kidneys and bilateral adrenal glands is otherwise unremarkable. The unenhanced appearance of the urinary bladder is normal. Stomach/Bowel: The unenhanced appearance of the stomach is normal. No pathologic dilatation of small bowel or colon. Normal appendix. Vascular/Lymphatic: Atherosclerosis throughout the abdominal and pelvic vasculature, without evidence of aneurysm. No lymphadenopathy noted in the abdomen or pelvis on today's noncontrast CT examination. Reproductive: Uterus and ovaries are unremarkable in appearance. Other: No significant volume of ascites.  No pneumoperitoneum. Musculoskeletal: There are no aggressive appearing lytic or blastic lesions noted in the visualized portions of the skeleton. IMPRESSION: 1. Nonobstructive calculi in the collecting systems of the kidneys bilaterally, largest of which measures 8 mm in the lower pole collecting system of the left kidney. No ureteral stones or findings of urinary tract obstruction are noted at this time. 2. No acute findings in the abdomen or pelvis otherwise noted. 3. Specifically, the appendix is normal. 4. Hepatic steatosis. 5. Atherosclerosis. Electronically Signed   By: Vinnie Langton M.D.    On: 05/22/2015 14:25   I have personally reviewed and evaluated these images and lab results as part of my medical decision-making.   EKG Interpretation None      MDM   Final diagnoses:  Flank pain  Bilateral low back pain without sciatica    Patient presents reporting history of a ureter kidney stone diagnosed by her family doctor. Patient with history of  kidney stones in the past. Patient with bilateral back pain and flank pain. And some dysuria. Workup here today with only evidence of stones in the kidneys and cells no ureter stones. Suspect that the pain is more musculoskeletal in nature. No evidence of urinary tract infection. There was a hint of a little bit of yeast in the urine which could perhaps represent some interstitial cystitis type of findings. To explain her complaints.  Patient nontoxic no acute distress.  Fredia Sorrow, MD 05/22/15 1500

## 2015-05-22 NOTE — ED Notes (Signed)
Seen by PCP last week and told she had kidney stones.  Rates pain 8/10.  Pt says she is having difficulty voiding.

## 2016-06-03 MED FILL — ESCITALOPRAM 20 MG TABLET: 20 | 30 days supply | Qty: 30 | Fill #0

## 2016-06-05 MED FILL — CYCLOBENZAPRINE 10 MG TAB: 10 | 30 days supply | Qty: 30 | Fill #0

## 2016-06-05 MED FILL — ATENOLOL 25 MG TABLET: 25 | 30 days supply | Qty: 30 | Fill #0

## 2016-06-05 MED FILL — LISINOPRIL 20 MG TABLET: 20 | 30 days supply | Qty: 30 | Fill #0

## 2016-06-12 MED FILL — SIMVASTATIN 20 MG TABLET: 20 | 30 days supply | Qty: 30 | Fill #0

## 2016-06-13 MED FILL — PHENTERMINE 37.5 MG TABLET: 37.5 | 30 days supply | Qty: 30 | Fill #0

## 2016-06-14 MED FILL — ALPRAZolam 1 MG TABS: 1 | 30 days supply | Qty: 60 | Fill #0

## 2016-06-28 ENCOUNTER — Encounter: Payer: Self-pay | Admitting: *Deleted

## 2016-07-08 MED FILL — CYCLOBENZAPRINE 10 MG TAB: 10 | 30 days supply | Qty: 30 | Fill #1

## 2016-07-08 MED FILL — ESCITALOPRAM 20 MG TABLET: 20 | 30 days supply | Qty: 30 | Fill #1

## 2016-07-10 ENCOUNTER — Encounter: Payer: Self-pay | Admitting: Obstetrics and Gynecology

## 2016-07-10 ENCOUNTER — Ambulatory Visit (INDEPENDENT_AMBULATORY_CARE_PROVIDER_SITE_OTHER): Payer: 59 | Admitting: Obstetrics and Gynecology

## 2016-07-10 VITALS — BP 125/87 | HR 86 | Ht 70.0 in | Wt 242.4 lb

## 2016-07-10 DIAGNOSIS — N83201 Unspecified ovarian cyst, right side: Secondary | ICD-10-CM | POA: Diagnosis not present

## 2016-07-10 DIAGNOSIS — R1032 Left lower quadrant pain: Secondary | ICD-10-CM | POA: Diagnosis not present

## 2016-07-10 DIAGNOSIS — N7011 Chronic salpingitis: Secondary | ICD-10-CM

## 2016-07-10 DIAGNOSIS — R350 Frequency of micturition: Secondary | ICD-10-CM | POA: Diagnosis not present

## 2016-07-10 LAB — POCT URINALYSIS DIPSTICK
Glucose, UA: NEGATIVE
Ketones, UA: NEGATIVE
Leukocytes, UA: NEGATIVE
Nitrite, UA: NEGATIVE
PROTEIN UA: NEGATIVE

## 2016-07-10 NOTE — Progress Notes (Signed)
[facility] Visit  07/10/16           Patient name: Megan Sweeney MRN [id number]  Date of birth: Nov 07, 1975  CC & HPI:  Megan Sweeney is a 41 y.o. female presenting today to establish care at this facility. She reports having left sided abdominal pain that is present when ovulating, the week prior to periods, and during intercourse. She reports having 2 prior HSG tests which showed blocked uterine tubes. She also reports having a fibroid. She was scheduled to have an abdominal hysterectomy with bilateral salpingectomy last year but lost insurance. She has regained insurance and would like to resume GYN care.   She reports family history of ovarian cancer with maternal grandmother. Family history negative for breast cancer.  patermal grandmother had Ca pancreas  ROS:  ROS +abdominal pain Otherwise negative   Pertinent History Reviewed:   Reviewed: Significant for ovarian cysts, fibroid Medical         Past Medical History:  Diagnosis Date  . Anxiety   . Back pain   . Cat scratch fever   . Hydrosalpinx   . Hypertension   . Infertility, female   . Kidney stones   . Mental disorder    anxiety  . Ovarian cyst, right   . Reflux                               Surgical Hx:    Past Surgical History:  Procedure Laterality Date  . ABDOMINAL SURGERY    . LITHOTRIPSY     Medications: Reviewed & Updated - see associated section                       Current Outpatient Prescriptions:  .  acetaminophen (TYLENOL) 500 MG tablet, Take 500 mg by mouth every 4 (four) hours as needed for mild pain or moderate pain., Disp: , Rfl:  .  ALPRAZolam (XANAX) 1 MG tablet, Take 1 mg by mouth at bedtime as needed for anxiety., Disp: , Rfl:  .  atenolol (TENORMIN) 25 MG tablet, Take 25 mg by mouth daily., Disp: , Rfl:  .  cyclobenzaprine (FLEXERIL) 10 MG tablet, Take 1 tablet (10 mg total) by mouth 2 (two) times daily as needed for muscle spasms., Disp: 20 tablet, Rfl: 0 .  escitalopram  (LEXAPRO) 20 MG tablet, Take 20 mg by mouth daily., Disp: , Rfl:  .  ibuprofen (ADVIL,MOTRIN) 200 MG tablet, Take 800 mg by mouth every 6 (six) hours as needed for moderate pain., Disp: , Rfl:  .  lisinopril (PRINIVIL,ZESTRIL) 10 MG tablet, Take 20 mg by mouth daily. , Disp: , Rfl:  .  Omega-3 Fatty Acids (FISH OIL BURP-LESS PO), Take 1 capsule by mouth daily., Disp: , Rfl:  .  promethazine (PHENERGAN) 25 MG tablet, Take 1 tablet (25 mg total) by mouth every 6 (six) hours as needed., Disp: 12 tablet, Rfl: 1 .  simvastatin (ZOCOR) 20 MG tablet, Take 20 mg by mouth daily., Disp: , Rfl:  .  buPROPion (WELLBUTRIN XL) 150 MG 24 hr tablet, Take 150 mg by mouth daily., Disp: , Rfl:  .  HYDROcodone-acetaminophen (NORCO/VICODIN) 5-325 MG per tablet, Take 1-2 tablets by mouth every 6 (six) hours as needed. (Patient not taking: Reported on 07/10/2016), Disp: 10 tablet, Rfl: 0 .  HYDROcodone-acetaminophen (NORCO/VICODIN) 5-325 MG tablet, Take 1-2 tablets by mouth every 6 (six) hours as needed. (  Patient not taking: Reported on 07/10/2016), Disp: 10 tablet, Rfl: 0 .  naproxen (NAPROSYN) 500 MG tablet, Take 1 tablet (500 mg total) by mouth 2 (two) times daily. (Patient not taking: Reported on 07/10/2016), Disp: 14 tablet, Rfl: 1 .  naproxen sodium (ANAPROX) 220 MG tablet, Take 220 mg by mouth daily as needed (for pain)., Disp: , Rfl:  .  pantoprazole (PROTONIX) 40 MG tablet, Take 40 mg by mouth daily., Disp: , Rfl:    Social History: Reviewed -  reports that she has been smoking Cigarettes.  She has been smoking about 0.50 packs per day. She has never used smokeless tobacco.  Objective Findings:  Vitals: Blood pressure 125/87, pulse 86, height 5\' 10"  (1.778 m), weight 242 lb 6.4 oz (110 kg), last menstrual period 06/17/2016.  Physical Examination: General appearance - alert, well appearing, and in no distress Pelvic - VULVA: normal appearing vulva with no masses, tenderness or lesions,  VAGINA: normal appearing  vagina with normal color and discharge, no lesions,  CERVIX: normal appearing cervix without discharge or lesions,  UTERUS: uterus is normal size, shape, consistency and nontender,  ADNEXA: normal adnexa in size, nontender and no masses  Discussion: 1. Weight loss, increase in physical activity and calorie counting  2. Concerns for cancer 3. I am not sure there's a Need for hysterectomy ,s he may only need a laparoscopy, LOA, and salpingectomy.  At end of discussion, pt had opportunity to ask questions and has no further questions at this time.   Specific discussion of weight loss and personal risks for cancer as noted above. Greater than 50% was spent in counseling and coordination of care with the patient.    The provider spent over 30 minutes with the visit, including pre visit review, documentation, with > than 50% spent in counseling and coordination of care.   Assessment & Plan:   A:  1. Resolved AUB 2. Dyspareunia, LLQ 3  Hx pelvic adhesions , s.p laparoscopy x 2 P:  1. Return in 2 weeks for [place] with jvf in room, to assess for pain source.    By signing my name below, I, Sonum Patel, attest that this documentation has been prepared under the direction and in the presence of Jonnie Kind, MD. Electronically Signed: Sonum Patel, Education administrator. 07/10/16. 10:30 AM.    I personally performed the services described in this documentation, which was SCRIBED in my presence. The recorded information has been reviewed and considered accurate. It has been edited as necessary during review. Jonnie Kind, MD

## 2016-07-11 MED FILL — SIMVASTATIN 20 MG TABLET: 20 | 30 days supply | Qty: 30 | Fill #1

## 2016-07-11 MED FILL — ALPRAZolam 1 MG TABS: 1 | 30 days supply | Qty: 60 | Fill #1

## 2016-07-11 MED FILL — ATENOLOL 25 MG TABLET: 25 | 30 days supply | Qty: 30 | Fill #1

## 2016-07-18 MED FILL — PHENTERMINE 37.5 MG TABLET: 37.5 | 30 days supply | Qty: 30 | Fill #1

## 2016-07-23 ENCOUNTER — Other Ambulatory Visit: Payer: Self-pay | Admitting: Obstetrics and Gynecology

## 2016-07-23 DIAGNOSIS — N941 Unspecified dyspareunia: Secondary | ICD-10-CM

## 2016-07-23 DIAGNOSIS — R1032 Left lower quadrant pain: Secondary | ICD-10-CM

## 2016-07-24 ENCOUNTER — Other Ambulatory Visit: Payer: 59

## 2016-07-24 ENCOUNTER — Ambulatory Visit: Payer: 59 | Admitting: Obstetrics and Gynecology

## 2016-07-31 ENCOUNTER — Telehealth: Payer: Self-pay | Admitting: Obstetrics and Gynecology

## 2016-07-31 ENCOUNTER — Ambulatory Visit (INDEPENDENT_AMBULATORY_CARE_PROVIDER_SITE_OTHER): Payer: 59 | Admitting: Obstetrics and Gynecology

## 2016-07-31 ENCOUNTER — Ambulatory Visit (INDEPENDENT_AMBULATORY_CARE_PROVIDER_SITE_OTHER): Payer: 59

## 2016-07-31 ENCOUNTER — Encounter: Payer: Self-pay | Admitting: Obstetrics and Gynecology

## 2016-07-31 VITALS — BP 122/82 | HR 80 | Ht 70.0 in | Wt 238.4 lb

## 2016-07-31 DIAGNOSIS — R1032 Left lower quadrant pain: Secondary | ICD-10-CM | POA: Diagnosis not present

## 2016-07-31 DIAGNOSIS — Z118 Encounter for screening for other infectious and parasitic diseases: Secondary | ICD-10-CM | POA: Diagnosis not present

## 2016-07-31 DIAGNOSIS — N83202 Unspecified ovarian cyst, left side: Secondary | ICD-10-CM

## 2016-07-31 DIAGNOSIS — N83291 Other ovarian cyst, right side: Secondary | ICD-10-CM

## 2016-07-31 DIAGNOSIS — N941 Unspecified dyspareunia: Secondary | ICD-10-CM | POA: Diagnosis not present

## 2016-07-31 DIAGNOSIS — D252 Subserosal leiomyoma of uterus: Secondary | ICD-10-CM

## 2016-07-31 DIAGNOSIS — R102 Pelvic and perineal pain: Secondary | ICD-10-CM

## 2016-07-31 DIAGNOSIS — Z1159 Encounter for screening for other viral diseases: Secondary | ICD-10-CM | POA: Diagnosis not present

## 2016-07-31 DIAGNOSIS — N83201 Unspecified ovarian cyst, right side: Secondary | ICD-10-CM | POA: Diagnosis not present

## 2016-07-31 DIAGNOSIS — N7011 Chronic salpingitis: Secondary | ICD-10-CM | POA: Diagnosis not present

## 2016-07-31 MED ORDER — METRONIDAZOLE 500 MG PO TABS: 500.0000 mg | ORAL_TABLET | Freq: Two times a day (BID) | ORAL | 0 refills | Status: DC

## 2016-07-31 MED ORDER — DOXYCYCLINE HYCLATE 100 MG PO CAPS: 100.0000 mg | ORAL_CAPSULE | Freq: Two times a day (BID) | ORAL | 1 refills | Status: AC

## 2016-07-31 MED ORDER — TRAMADOL HCL 50 MG PO TABS: 50.0000 mg | ORAL_TABLET | Freq: Four times a day (QID) | ORAL | 0 refills | Status: DC | PRN

## 2016-07-31 MED ORDER — DOXYCYCLINE HYCLATE 100 MG PO CAPS: 100.0000 mg | ORAL_CAPSULE | Freq: Two times a day (BID) | ORAL | 1 refills | Status: DC

## 2016-07-31 NOTE — Addendum Note (Signed)
Addended by: Jonnie Kind on: 07/31/2016 02:30 PM   Modules accepted: Orders

## 2016-07-31 NOTE — Telephone Encounter (Signed)
Patient made aware that prescriptions were sent initially but were resent. If they did not received them to please call us back. Pt verbalized understanding.

## 2016-07-31 NOTE — Progress Notes (Addendum)
[facility] Visit  07/31/16            Patient name: Megan Sweeney MRN [id number]  Date of birth: [birth year]  CC & HPI:  Megan Sweeney is a 41 y.o. female presenting today for follow up from last visit to have [place] completed. She states her pain has been ongoing for several years but has been worse for the last 1 year, particularly worst over this last 1 month. She reports having chills that began a few days ago. She denies a measured fever.    She denies history of abnormal pap smears.   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 Anxiety   . Back pain   . Cat scratch fever   . Hydrosalpinx   . Hypertension   . Infertility, female   . Kidney stones   . Mental disorder    anxiety  . Ovarian cyst, right   . Reflux                               Surgical Hx:    Past Surgical History:  Procedure Laterality Date  . ABDOMINAL SURGERY    . LITHOTRIPSY     Medications: Reviewed & Updated - see associated section                       Current Outpatient Prescriptions:  .  acetaminophen (TYLENOL) 500 MG tablet, Take 500 mg by mouth every 4 (four) hours as needed for mild pain or moderate pain., Disp: , Rfl:  .  ALPRAZolam (XANAX) 1 MG tablet, Take 1 mg by mouth at bedtime as needed for anxiety., Disp: , Rfl:  .  atenolol (TENORMIN) 25 MG tablet, Take 25 mg by mouth daily., Disp: , Rfl:  .  cyclobenzaprine (FLEXERIL) 10 MG tablet, Take 1 tablet (10 mg total) by mouth 2 (two) times daily as needed for muscle spasms., Disp: 20 tablet, Rfl: 0 .  escitalopram (LEXAPRO) 20 MG tablet, Take 20 mg by mouth daily., Disp: , Rfl:  .  esomeprazole (NEXIUM) 40 MG capsule, Take 40 mg by mouth daily at 12 noon., Disp: , Rfl:  .  ibuprofen (ADVIL,MOTRIN) 200 MG tablet, Take 800 mg by mouth  every 6 (six) hours as needed for moderate pain., Disp: , Rfl:  .  lisinopril (PRINIVIL,ZESTRIL) 10 MG tablet, Take 20 mg by mouth daily. , Disp: , Rfl:  .  Omega-3 Fatty Acids (FISH OIL BURP-LESS PO), Take 1 capsule by mouth daily., Disp: , Rfl:  .  promethazine (PHENERGAN) 25 MG tablet, Take 1 tablet (25 mg total) by mouth every 6 (six) hours as needed., Disp: 12 tablet, Rfl: 1 .  simvastatin (ZOCOR) 20 MG tablet, Take 20 mg by mouth daily., Disp: , Rfl:  .  naproxen sodium (ANAPROX) 220 MG tablet, Take 220 mg by mouth daily as needed (for pain)., Disp: , Rfl:    Social History: Reviewed -  reports that she has been smoking Cigarettes.  She has been smoking about 0.50 packs per day. She has never used smokeless tobacco.  Objective Findings:  Vitals: Blood pressure 122/82, pulse 80, height 5' 10"  (1.778 m), weight  238 lb 6.4 oz (108.1 kg), last menstrual period 07/15/2016.  Physical Examination: General appearance - alert, well appearing, and in no distress and oriented to person, place, and time Abdominal - bilateral tenderness in area of uterus and adnexa  Pelvic - VULVA: normal appearing vulva with no masses, tenderness or lesions,  VAGINA: normal appearing vagina with normal color and discharge, no lesions, good vaginal length CERVIX: difficult to feel cervix due to body habitus  UTERUS: tenderness   ADNEXA: tenderness bilateral    The provider spent over 25 minutes with the visit , including previsit review, and documentation,with >than 50% spent in counseling and coordination of care. Specific discussion on surgical options that would best resolve her symptoms of heavy bleeding, dyspareunia, and pelvic pain from ovarian cysts.   Assessment & Plan:   A:  1. Resolved AUB 2. Dyspareunia, LLQ 3. Right hydrosalpinx, right ovarian cyst  P:  1. Obtain labs: CBC with diff, ESR to rule out acute exacerabation 2. Start Flagyl x 7 days, Doxycycline x 7 days, Tramadol 50 mg x 30 tablets   3. Patient to monitor temperature 4. Return in 2 weeks for pre-op visit   Note: pt calls stating Rx's did not escribe, so Rx's retransmitted and tramadol signed and sent by Fax. By signing my name below, I, Sonum Patel, attest that this documentation has been prepared under the direction and in the presence of Jonnie Kind, MD. Electronically Signed: Sonum Patel, Education administrator. 07/31/16. 11:11 AM.  I personally performed the services described in this documentation, which was SCRIBED in my presence. The recorded information has been reviewed and considered accurate. It has been edited as necessary during review. Jonnie Kind, MD

## 2016-07-31 NOTE — Progress Notes (Signed)
PELVIC US TA/TV: heterogeneous anteverted uterus w/a 2.8 x 2 x 2.4 cm subserosal fibroid,EEC 6.4 mm,normal left ovary,complex right pyosalpinx 6.6 x 3.6 x 5 cm,hemorrhagic right ovarian cyst 4.3 x 3.6 x  2.9 cm,right adnexal pain during ultrasound,ovaries appear mobile,no free fluid seen,Dr Glo Herring review images during ultrasound

## 2016-08-01 ENCOUNTER — Telehealth: Payer: Self-pay | Admitting: *Deleted

## 2016-08-01 LAB — CBC WITH DIFFERENTIAL/PLATELET
Basophils Absolute: 0 10*3/uL (ref 0.0–0.2)
Basos: 0 %
EOS (ABSOLUTE): 0.4 10*3/uL (ref 0.0–0.4)
EOS: 4 %
HEMATOCRIT: 36.4 % (ref 34.0–46.6)
HEMOGLOBIN: 12.3 g/dL (ref 11.1–15.9)
IMMATURE GRANS (ABS): 0 10*3/uL (ref 0.0–0.1)
IMMATURE GRANULOCYTES: 0 %
LYMPHS ABS: 2.4 10*3/uL (ref 0.7–3.1)
LYMPHS: 27 %
MCH: 28 pg (ref 26.6–33.0)
MCHC: 33.8 g/dL (ref 31.5–35.7)
MCV: 83 fL (ref 79–97)
MONOCYTES: 10 %
Monocytes Absolute: 0.9 10*3/uL (ref 0.1–0.9)
Neutrophils Absolute: 5.2 10*3/uL (ref 1.4–7.0)
Neutrophils: 59 %
Platelets: 371 10*3/uL (ref 150–379)
RBC: 4.39 x10E6/uL (ref 3.77–5.28)
RDW: 15.2 % (ref 12.3–15.4)
WBC: 8.9 10*3/uL (ref 3.4–10.8)

## 2016-08-01 LAB — SEDIMENTATION RATE: Sed Rate: 36 mm/hr — ABNORMAL HIGH (ref 0–32)

## 2016-08-01 NOTE — Telephone Encounter (Signed)
Pharmacy called regarding Tramadol prescription stating that they did not receive it electronically. Verbal order given for Tramadol 50mg  tabs q6h prn moderate to severe pain; 30 tabs no refills per Dr Maretta Bees previous electronic order.

## 2016-08-02 LAB — GC/CHLAMYDIA PROBE AMP
Chlamydia trachomatis, NAA: NEGATIVE
Neisseria gonorrhoeae by PCR: NEGATIVE

## 2016-08-07 MED FILL — CYCLOBENZAPRINE 10 MG TAB: 10 | 30 days supply | Qty: 30 | Fill #2

## 2016-08-07 MED FILL — ESCITALOPRAM 20 MG TABLET: 20 | 30 days supply | Qty: 30 | Fill #2

## 2016-08-08 ENCOUNTER — Telehealth: Payer: Self-pay | Admitting: *Deleted

## 2016-08-08 MED FILL — LISINOPRIL 20 MG TABLET: 20 | 30 days supply | Qty: 30 | Fill #1

## 2016-08-08 NOTE — Telephone Encounter (Signed)
Pt requesting that her Tramadol dosage be increased. It is helping some but is still in pain. She doesn't want to try anything else. Please advise.

## 2016-08-09 MED FILL — ALPRAZolam 1 MG TABS: 1 | 30 days supply | Qty: 60 | Fill #2

## 2016-08-10 NOTE — Telephone Encounter (Signed)
To make appt.

## 2016-08-12 NOTE — Telephone Encounter (Signed)
Called patient to inform her that Tramadol prescription will not be increased without a visit. Pt states she has an appointment on 2/21 for pre-op for hysterectomy. Suggested patient try to reschedule appointment to be seen sooner but patient stated she did not want to change appointment. Will come on 2/21.

## 2016-08-15 MED FILL — ATENOLOL 25 MG TABLET: 25 | 30 days supply | Qty: 30 | Fill #0

## 2016-08-19 MED FILL — SIMVASTATIN 20 MG TABLET: 20 | 30 days supply | Qty: 30 | Fill #2

## 2016-08-21 ENCOUNTER — Ambulatory Visit (INDEPENDENT_AMBULATORY_CARE_PROVIDER_SITE_OTHER): Payer: 59 | Admitting: Obstetrics and Gynecology

## 2016-08-21 ENCOUNTER — Encounter: Payer: Self-pay | Admitting: Obstetrics and Gynecology

## 2016-08-21 VITALS — BP 122/88 | HR 80 | Ht 70.0 in | Wt 239.2 lb

## 2016-08-21 DIAGNOSIS — N7011 Chronic salpingitis: Secondary | ICD-10-CM | POA: Diagnosis not present

## 2016-08-21 DIAGNOSIS — R102 Pelvic and perineal pain: Secondary | ICD-10-CM

## 2016-08-21 MED ORDER — TRAMADOL HCL 50 MG PO TABS: 50.0000 mg | ORAL_TABLET | Freq: Four times a day (QID) | ORAL | 0 refills | Status: DC | PRN

## 2016-08-21 NOTE — Progress Notes (Addendum)
[facility] Visit  08/21/16            Patient name: Megan Sweeney MRN [id number]  Date of birth: 09-03-1975  CC & HPI:  Megan Sweeney is a 41 y.o. female presenting today for follow up to discuss [place] results. She continues to have right ovary related abdominal pain. She denies heavy or irregular bleeding.  She has a slightly enlarged ovary on right, considered WNL, and an adjacent hydrosalpinx.  pt's main concern is whether there's a significant risk of cancer.  Pt informed that I consider cancer risk low. ROS:  ROS +pain No fever, no vomiting   Pertinent History Reviewed:   Reviewed: Significant for hydrosalpinx, ovarian cyst Medical         Past Medical History:  Diagnosis Date  . Anxiety   . Back pain   . Cat scratch fever   . Hydrosalpinx   . Hypertension   . Infertility, female   . Kidney stones   . Mental disorder    anxiety  . Ovarian cyst, right   . Reflux                               Surgical Hx:    Past Surgical History:  Procedure Laterality Date  . ABDOMINAL SURGERY    . LITHOTRIPSY     Medications: Reviewed & Updated - see associated section                       Current Outpatient Prescriptions:  .  ALPRAZolam (XANAX) 1 MG tablet, Take 1 mg by mouth at bedtime as needed for anxiety., Disp: , Rfl:  .  atenolol (TENORMIN) 25 MG tablet, Take 25 mg by mouth daily., Disp: , Rfl:  .  cyclobenzaprine (FLEXERIL) 10 MG tablet, Take 1 tablet (10 mg total) by mouth 2 (two) times daily as needed for muscle spasms., Disp: 20 tablet, Rfl: 0 .  escitalopram (LEXAPRO) 20 MG tablet, Take 20 mg by mouth daily., Disp: , Rfl:  .  esomeprazole (NEXIUM) 40 MG capsule, Take 40 mg by mouth daily at 12 noon., Disp: , Rfl:  .  ibuprofen (ADVIL,MOTRIN) 200 MG tablet, Take 800 mg by mouth every 6 (six) hours as needed for moderate pain., Disp: , Rfl:  .  lisinopril (PRINIVIL,ZESTRIL) 10 MG tablet, Take 20 mg by mouth daily. , Disp: , Rfl:  .  naproxen sodium (ANAPROX)  220 MG tablet, Take 220 mg by mouth daily as needed (for pain)., Disp: , Rfl:  .  Omega-3 Fatty Acids (FISH OIL BURP-LESS PO), Take 1 capsule by mouth daily., Disp: , Rfl:  .  promethazine (PHENERGAN) 25 MG tablet, Take 1 tablet (25 mg total) by mouth every 6 (six) hours as needed., Disp: 12 tablet, Rfl: 1 .  simvastatin (ZOCOR) 20 MG tablet, Take 20 mg by mouth daily., Disp: , Rfl:  .  traMADol (ULTRAM) 50 MG tablet, Take 1 tablet (50 mg total) by mouth every 6 (six) hours as needed for moderate pain or severe pain., Disp: 30 tablet, Rfl: 0 .  acetaminophen (TYLENOL) 500 MG tablet, Take 500 mg by mouth every 4 (four) hours as needed for mild pain or moderate pain., Disp: , Rfl:  .  doxycycline (VIBRAMYCIN) 100 MG capsule, Take 1 capsule (100 mg total) by mouth 2 (two) times daily. (Patient not taking: Reported on 08/21/2016), Disp: 14 capsule, Rfl: 1 .  metroNIDAZOLE (FLAGYL) 500 MG tablet, Take 1 tablet (500 mg total) by mouth 2 (two) times daily. (Patient not taking: Reported on 08/21/2016), Disp: 14 tablet, Rfl: 0   Social History: Reviewed -  reports that she has been smoking Cigarettes.  She has been smoking about 0.50 packs per day. She has never used smokeless tobacco.  Objective Findings:  Vitals: Blood pressure 122/88, pulse 80, height 5\' 10"  (1.778 m), weight 239 lb 3.2 oz (108.5 kg), last menstrual period 08/11/2016.  Physical Examination: discussion only   The provider spent over 25 minutes with the visit, including pre visit review, documentation, with >than 50% spent in counseling and coordination of care. Specific discussion of surgical options with the conclusion that she will have a bilateral salpingectomy and will consider endometrial ablation at that time.   Assessment & Plan:   A:   1. Right hydrosalpinx  2.pelvic pain  P:  1. Return for endometrial biopsy in 1-2 weeks, schedule surgery at next appt  2. Endometrial ablation to consider at time of bilateral salpingectomy  for hysrosalpinx 3. Refill Tramadol 50 mg x 30 4. Advised to supplement with ibuprofen     By signing my name below, I, Sonum Patel, attest that this documentation has been prepared under the direction and in the presence of Jonnie Kind, MD. Electronically Signed: Sonum Patel, Education administrator. 08/21/16. 10:11 AM.  I personally performed the services described in this documentation, which was SCRIBED in my presence. The recorded information has been reviewed and considered accurate. It has been edited as necessary during review. Jonnie Kind, MD

## 2016-08-26 MED FILL — PHENTERMINE 37.5 MG TABLET: 37.5 | 30 days supply | Qty: 30 | Fill #2

## 2016-09-02 ENCOUNTER — Telehealth: Payer: Self-pay | Admitting: Obstetrics and Gynecology

## 2016-09-02 ENCOUNTER — Encounter: Payer: Self-pay | Admitting: *Deleted

## 2016-09-02 NOTE — Telephone Encounter (Signed)
Pt called stating that she would like a medication called in to her pharmacy. Please contact pt

## 2016-09-03 ENCOUNTER — Telehealth: Payer: Commercial Managed Care - PPO | Admitting: Nurse Practitioner

## 2016-09-03 ENCOUNTER — Other Ambulatory Visit: Payer: Self-pay | Admitting: Obstetrics and Gynecology

## 2016-09-03 DIAGNOSIS — B9689 Other specified bacterial agents as the cause of diseases classified elsewhere: Secondary | ICD-10-CM

## 2016-09-03 DIAGNOSIS — N760 Acute vaginitis: Secondary | ICD-10-CM

## 2016-09-03 MED ORDER — METRONIDAZOLE 500 MG PO TABS: 500.0000 mg | ORAL_TABLET | Freq: Two times a day (BID) | ORAL | 0 refills | Status: DC

## 2016-09-03 MED FILL — ESCITALOPRAM 20 MG TABLET: 20 | 30 days supply | Qty: 30 | Fill #3

## 2016-09-03 MED FILL — CYCLOBENZAPRINE 10 MG TAB: 10 | 30 days supply | Qty: 30 | Fill #3

## 2016-09-03 NOTE — Progress Notes (Signed)

## 2016-09-04 ENCOUNTER — Other Ambulatory Visit: Payer: 59 | Admitting: Obstetrics and Gynecology

## 2016-09-05 ENCOUNTER — Other Ambulatory Visit: Payer: Self-pay | Admitting: Obstetrics and Gynecology

## 2016-09-05 DIAGNOSIS — B9689 Other specified bacterial agents as the cause of diseases classified elsewhere: Secondary | ICD-10-CM | POA: Insufficient documentation

## 2016-09-05 DIAGNOSIS — N760 Acute vaginitis: Principal | ICD-10-CM

## 2016-09-05 MED ORDER — METRONIDAZOLE 500 MG PO TABS: 500.0000 mg | ORAL_TABLET | Freq: Two times a day (BID) | ORAL | 0 refills | Status: DC

## 2016-09-05 NOTE — Progress Notes (Signed)
Refilled  metronidazole Pt has visit next week

## 2016-09-11 DIAGNOSIS — K219 Gastro-esophageal reflux disease without esophagitis: Secondary | ICD-10-CM | POA: Diagnosis not present

## 2016-09-11 DIAGNOSIS — Z713 Dietary counseling and surveillance: Secondary | ICD-10-CM | POA: Diagnosis not present

## 2016-09-11 DIAGNOSIS — F419 Anxiety disorder, unspecified: Secondary | ICD-10-CM | POA: Diagnosis not present

## 2016-09-11 DIAGNOSIS — M549 Dorsalgia, unspecified: Secondary | ICD-10-CM | POA: Diagnosis not present

## 2016-09-11 DIAGNOSIS — Z6834 Body mass index (BMI) 34.0-34.9, adult: Secondary | ICD-10-CM | POA: Diagnosis not present

## 2016-09-11 DIAGNOSIS — I10 Essential (primary) hypertension: Secondary | ICD-10-CM | POA: Diagnosis not present

## 2016-09-11 MED FILL — ATENOLOL 25 MG TABLET: 25 | 30 days supply | Qty: 30 | Fill #0

## 2016-09-11 MED FILL — PANTOPRAZOLE SOD DR 40 MG T: 40 | 90 days supply | Qty: 90 | Fill #0

## 2016-09-11 MED FILL — LISINOPRIL 20 MG TABLET: 20 | 90 days supply | Qty: 90 | Fill #0

## 2016-09-12 ENCOUNTER — Other Ambulatory Visit: Payer: 59 | Admitting: Obstetrics and Gynecology

## 2016-09-12 MED FILL — SIMVASTATIN 20 MG TABLET: 20 | 90 days supply | Qty: 90 | Fill #0

## 2016-09-25 ENCOUNTER — Other Ambulatory Visit: Payer: 59 | Admitting: Obstetrics and Gynecology

## 2016-09-25 DIAGNOSIS — Z5181 Encounter for therapeutic drug level monitoring: Secondary | ICD-10-CM | POA: Diagnosis not present

## 2016-09-25 DIAGNOSIS — F419 Anxiety disorder, unspecified: Secondary | ICD-10-CM | POA: Diagnosis not present

## 2016-10-01 MED FILL — CYCLOBENZAPRINE 10 MG TAB: 10 | 30 days supply | Qty: 30 | Fill #4

## 2016-10-01 MED FILL — ESCITALOPRAM 20 MG TABLET: 20 | 90 days supply | Qty: 90 | Fill #0

## 2016-10-09 ENCOUNTER — Other Ambulatory Visit: Payer: 59 | Admitting: Obstetrics and Gynecology

## 2016-10-21 ENCOUNTER — Other Ambulatory Visit: Payer: 59 | Admitting: Obstetrics and Gynecology

## 2016-10-25 MED FILL — ATENOLOL 25 MG TABLET: 25 | 30 days supply | Qty: 30 | Fill #1

## 2016-11-01 MED FILL — CYCLOBENZAPRINE 10 MG TAB: 10 | 90 days supply | Qty: 90 | Fill #0

## 2016-11-04 DIAGNOSIS — Z720 Tobacco use: Secondary | ICD-10-CM | POA: Diagnosis not present

## 2016-11-04 DIAGNOSIS — J9811 Atelectasis: Secondary | ICD-10-CM | POA: Diagnosis not present

## 2016-11-04 DIAGNOSIS — I10 Essential (primary) hypertension: Secondary | ICD-10-CM | POA: Diagnosis not present

## 2016-11-04 DIAGNOSIS — Z882 Allergy status to sulfonamides status: Secondary | ICD-10-CM | POA: Diagnosis not present

## 2016-11-04 DIAGNOSIS — N200 Calculus of kidney: Secondary | ICD-10-CM | POA: Diagnosis not present

## 2016-11-04 DIAGNOSIS — K859 Acute pancreatitis without necrosis or infection, unspecified: Secondary | ICD-10-CM | POA: Diagnosis not present

## 2016-11-04 DIAGNOSIS — N83202 Unspecified ovarian cyst, left side: Secondary | ICD-10-CM | POA: Diagnosis not present

## 2016-11-04 DIAGNOSIS — Z79899 Other long term (current) drug therapy: Secondary | ICD-10-CM | POA: Diagnosis not present

## 2016-11-04 DIAGNOSIS — R197 Diarrhea, unspecified: Secondary | ICD-10-CM | POA: Diagnosis not present

## 2016-11-11 ENCOUNTER — Other Ambulatory Visit: Payer: 59 | Admitting: Obstetrics and Gynecology

## 2016-11-12 ENCOUNTER — Other Ambulatory Visit: Payer: 59 | Admitting: Obstetrics and Gynecology

## 2016-11-27 ENCOUNTER — Other Ambulatory Visit: Payer: 59 | Admitting: Obstetrics and Gynecology

## 2016-11-27 ENCOUNTER — Encounter: Payer: Self-pay | Admitting: *Deleted

## 2016-11-28 MED FILL — ATENOLOL 25 MG TABLET: 25 | 30 days supply | Qty: 30 | Fill #2

## 2016-12-05 MED FILL — SIMVASTATIN 20 MG TABLET: 20 | 90 days supply | Qty: 90 | Fill #1

## 2016-12-05 MED FILL — PANTOPRAZOLE SOD DR 40 MG T: 40 | 90 days supply | Qty: 90 | Fill #1

## 2016-12-05 MED FILL — LISINOPRIL 20 MG TABLET: 20 | 90 days supply | Qty: 90 | Fill #1

## 2016-12-11 ENCOUNTER — Other Ambulatory Visit: Payer: 59 | Admitting: Obstetrics and Gynecology

## 2016-12-24 DIAGNOSIS — Z713 Dietary counseling and surveillance: Secondary | ICD-10-CM | POA: Diagnosis not present

## 2016-12-24 DIAGNOSIS — F410 Panic disorder [episodic paroxysmal anxiety] without agoraphobia: Secondary | ICD-10-CM | POA: Diagnosis not present

## 2016-12-24 DIAGNOSIS — F319 Bipolar disorder, unspecified: Secondary | ICD-10-CM | POA: Diagnosis not present

## 2016-12-24 DIAGNOSIS — Z6833 Body mass index (BMI) 33.0-33.9, adult: Secondary | ICD-10-CM | POA: Diagnosis not present

## 2016-12-24 DIAGNOSIS — F429 Obsessive-compulsive disorder, unspecified: Secondary | ICD-10-CM | POA: Diagnosis not present

## 2016-12-24 DIAGNOSIS — F4310 Post-traumatic stress disorder, unspecified: Secondary | ICD-10-CM | POA: Diagnosis not present

## 2016-12-24 MED FILL — ATENOLOL 25 MG TABLET: 25 | 90 days supply | Qty: 90 | Fill #0

## 2016-12-24 MED FILL — ESCITALOPRAM 20 MG TABLET: 20 | 90 days supply | Qty: 90 | Fill #0

## 2016-12-28 DIAGNOSIS — F419 Anxiety disorder, unspecified: Secondary | ICD-10-CM | POA: Diagnosis not present

## 2016-12-28 DIAGNOSIS — I10 Essential (primary) hypertension: Secondary | ICD-10-CM | POA: Diagnosis not present

## 2016-12-28 DIAGNOSIS — E559 Vitamin D deficiency, unspecified: Secondary | ICD-10-CM | POA: Diagnosis not present

## 2016-12-28 DIAGNOSIS — R7302 Impaired glucose tolerance (oral): Secondary | ICD-10-CM | POA: Diagnosis not present

## 2016-12-28 DIAGNOSIS — E785 Hyperlipidemia, unspecified: Secondary | ICD-10-CM | POA: Diagnosis not present

## 2016-12-30 ENCOUNTER — Encounter: Payer: Self-pay | Admitting: Obstetrics and Gynecology

## 2016-12-30 ENCOUNTER — Ambulatory Visit (INDEPENDENT_AMBULATORY_CARE_PROVIDER_SITE_OTHER): Payer: 59 | Admitting: Obstetrics and Gynecology

## 2016-12-30 VITALS — BP 90/60 | HR 80 | Wt 228.6 lb

## 2016-12-30 DIAGNOSIS — Z8742 Personal history of other diseases of the female genital tract: Secondary | ICD-10-CM

## 2016-12-30 DIAGNOSIS — Z1389 Encounter for screening for other disorder: Secondary | ICD-10-CM | POA: Diagnosis not present

## 2016-12-30 DIAGNOSIS — Z3202 Encounter for pregnancy test, result negative: Secondary | ICD-10-CM

## 2016-12-30 DIAGNOSIS — Z3169 Encounter for other general counseling and advice on procreation: Secondary | ICD-10-CM | POA: Diagnosis not present

## 2016-12-30 LAB — POCT URINALYSIS DIPSTICK
Blood, UA: NEGATIVE
GLUCOSE UA: NEGATIVE
Ketones, UA: NEGATIVE
LEUKOCYTES UA: NEGATIVE
Nitrite, UA: NEGATIVE

## 2016-12-30 LAB — POCT URINE PREGNANCY: Preg Test, Ur: NEGATIVE

## 2016-12-30 NOTE — Progress Notes (Addendum)
HPI Comments:    [facility] Visit  @DATE @            Patient name: Megan Sweeney MRN [id number]  Date of birth: [birth year]  CC & HPI:  Megan Sweeney is a 41 y.o. female presenting today originally to have endometrial biopsy, but has questions about possible IVF. She has no new physical complaints or symptoms.  ROS:  ROS Otherwise negative for acute change except as noted in the HPI.   Pertinent History Reviewed:   Reviewed: Significant for hydrosalpinx, ovarian cyst, female infertility Patient reports that she once had laparoscopy and "cleaning up my tubes" in a facility in [place], asked to get information for [place]. Medical         Past Medical History:  Diagnosis Date  . Anxiety   . Back pain   . Cat scratch fever   . Hydrosalpinx   . Hypertension   . Infertility, female   . Kidney stones   . Mental disorder    anxiety  . Ovarian cyst, right   . Reflux                               Surgical Hx:    Past Surgical History:  Procedure Laterality Date  . ABDOMINAL SURGERY    . LITHOTRIPSY     Medications: Reviewed & Updated - see associated section                       Current Outpatient Prescriptions:  .  acetaminophen (TYLENOL) 500 MG tablet, Take 500 mg by mouth every 4 (four) hours as needed for mild pain or moderate pain., Disp: , Rfl:  .  ALPRAZolam (XANAX) 1 MG tablet, Take 1 mg by mouth at bedtime as needed for anxiety., Disp: , Rfl:  .  atenolol (TENORMIN) 25 MG tablet, Take 25 mg by mouth daily., Disp: , Rfl:  .  cyclobenzaprine (FLEXERIL) 10 MG tablet, Take 1 tablet (10 mg total) by mouth 2 (two) times daily as needed for muscle spasms., Disp: 20 tablet, Rfl: 0 .  escitalopram (LEXAPRO) 20 MG tablet, Take 20 mg by mouth daily., Disp: , Rfl:  .  esomeprazole (NEXIUM) 40 MG capsule, Take 40 mg by mouth daily at 12 noon., Disp: , Rfl:  .  lisinopril (PRINIVIL,ZESTRIL) 10 MG tablet, Take 20 mg by mouth daily. , Disp: , Rfl:  .  Omega-3 Fatty Acids (FISH  OIL BURP-LESS PO), Take 1 capsule by mouth daily., Disp: , Rfl:  .  simvastatin (ZOCOR) 20 MG tablet, Take 20 mg by mouth daily., Disp: , Rfl:  .  traMADol (ULTRAM) 50 MG tablet, Take 1 tablet (50 mg total) by mouth every 6 (six) hours as needed for moderate pain or severe pain., Disp: 30 tablet, Rfl: 0 .  doxycycline (VIBRAMYCIN) 100 MG capsule, Take 1 capsule (100 mg total) by mouth 2 (two) times daily. (Patient not taking: Reported on 08/21/2016), Disp: 14 capsule, Rfl: 1 .  ibuprofen (ADVIL,MOTRIN) 200 MG tablet, Take 800 mg by mouth every 6 (six) hours as needed for moderate pain., Disp: , Rfl:  .  promethazine (PHENERGAN) 25 MG tablet, Take 1 tablet (25 mg total) by mouth every 6 (six) hours as needed., Disp: 12 tablet, Rfl: 1   Social History: Reviewed -  reports that she has been smoking Cigarettes.  She has been smoking about 0.25 packs per day. She  has never used smokeless tobacco.  Objective Findings:  Vitals: Blood pressure 90/60, pulse 80, weight 228 lb 9.6 oz (103.7 kg), last menstrual period 12/13/2016.  Physical Examination: General appearance - alert, well appearing, and in no distress, oriented to person, place, and time and normal appearing weight Mental status - alert, oriented to person, place, and time, normal mood, behavior, speech, dress, motor activity, and thought processes Pelvic -  VULVA: normal appearing vulva with no masses, tenderness or lesions,  VAGINA: normal appearing vagina with normal color and discharge, no lesions,  CERVIX: ,  UTERUS:    Transvaginal bedsideOffice U/S doneBy JV F 1.5 cm posterior fundal fibroid found  2 endo Endometrium is thin uniform in character, and 2 Nabothian cysts in the endocervical Sweeney and 2 additional nabothian cysts on the cervix  Assessment & Plan:   A:  1. Long-standing history of infertility 2. Resolved pelvic pain resolved suspected right hydrosalpinx, resolved hemorrhagic cyst right ovary 3. Patient with some  ambivalence regarding future fertility  P:  1. Referred to infertility specialist given her age given numbers for an [facility], (August. for reproductive medicine), [facility] and Dr. Governor Specking    By signing my name below, I, Evelene Croon, attest that this documentation has been prepared under the direction and in the presence of Jonnie Kind, MD . Electronically Signed: Evelene Croon, Scribe. 12/30/2016. 2:18 PM. I personally performed the services described in this documentation, which was SCRIBED in my presence. The recorded information has been reviewed and considered accurate. It has been edited as necessary during review. Jonnie Kind, MD

## 2016-12-30 NOTE — Patient Instructions (Addendum)
Fertility specialists: [facility]  [place]. for reproductive medicine [facility], [place], Dr Dellis Filbert deaton.

## 2016-12-31 DIAGNOSIS — R7302 Impaired glucose tolerance (oral): Secondary | ICD-10-CM | POA: Diagnosis not present

## 2016-12-31 DIAGNOSIS — E559 Vitamin D deficiency, unspecified: Secondary | ICD-10-CM | POA: Diagnosis not present

## 2016-12-31 DIAGNOSIS — F419 Anxiety disorder, unspecified: Secondary | ICD-10-CM | POA: Diagnosis not present

## 2016-12-31 DIAGNOSIS — I10 Essential (primary) hypertension: Secondary | ICD-10-CM | POA: Diagnosis not present

## 2016-12-31 DIAGNOSIS — E785 Hyperlipidemia, unspecified: Secondary | ICD-10-CM | POA: Diagnosis not present

## 2017-01-06 MED FILL — VIT D2 1.25 MG (50,000 UNIT: 1.25 MG | 28 days supply | Qty: 8 | Fill #0

## 2017-01-13 ENCOUNTER — Ambulatory Visit: Payer: 59 | Admitting: Obstetrics and Gynecology

## 2017-01-13 ENCOUNTER — Telehealth: Payer: Self-pay | Admitting: Obstetrics and Gynecology

## 2017-01-13 DIAGNOSIS — Z319 Encounter for procreative management, unspecified: Secondary | ICD-10-CM

## 2017-01-13 NOTE — Telephone Encounter (Signed)
Patient states she did not come to her appointment today because everything was discussed at her last appointment. She states it was mentioned about fertility labs being drawn while she is on her cycle. States she started Friday. She would like the labs sent to [facility] so they can be drawn there and then [facility] will pick up. Please advise.

## 2017-01-13 NOTE — Telephone Encounter (Signed)
Pt called stating that she would like her labs sent to [facility] lab so she could get it drawn there. Please contact pt

## 2017-01-14 NOTE — Telephone Encounter (Signed)
Patient calls requesting blood testing for ovarian reserve. A stay cycle 5 now so we passed a optimal day for testing [facility] and estradiol level which is day 3. I'll therefore give the patient the lab slips for [facility] and estradiol levels to be drawn on cycle day 3 of her next menses

## 2017-01-23 MED FILL — CYCLOBENZAPRINE 10 MG TAB: 10 | 90 days supply | Qty: 90 | Fill #1

## 2017-01-30 MED FILL — lamoTRIgine 25 MG TABS: 25 | 15 days supply | Qty: 30 | Fill #0

## 2017-02-06 MED FILL — VIT D2 1.25 MG (50,000 UNIT: 1.25 MG | 28 days supply | Qty: 8 | Fill #1

## 2017-02-17 MED FILL — lamoTRIgine 25 MG TABS: 25 | 15 days supply | Qty: 30 | Fill #0

## 2017-02-17 MED FILL — PANTOPRAZOLE SOD DR 40 MG T: 40 | 90 days supply | Qty: 90 | Fill #0

## 2017-03-06 ENCOUNTER — Other Ambulatory Visit (HOSPITAL_COMMUNITY)
Admission: RE | Admit: 2017-03-06 | Discharge: 2017-03-06 | Disposition: A | Discharge status: Home / Self Care | Payer: Commercial Managed Care - PPO | Source: Ambulatory Visit | Attending: Obstetrics and Gynecology | Admitting: Obstetrics and Gynecology

## 2017-03-06 DIAGNOSIS — Z319 Encounter for procreative management, unspecified: Secondary | ICD-10-CM | POA: Insufficient documentation

## 2017-03-07 LAB — FOLLICLE STIMULATING HORMONE: FSH: 6.4 m[IU]/mL

## 2017-03-07 LAB — ESTRADIOL: Estradiol: 24.3 pg/mL

## 2017-03-11 MED FILL — LISINOPRIL 20 MG TABS: 20 | 90 days supply | Qty: 90 | Fill #2

## 2017-03-11 MED FILL — lamoTRIgine 25 MG TABS: 25 | 15 days supply | Qty: 30 | Fill #0

## 2017-03-12 MED FILL — VIT D2 1.25 MG (50,000 UNIT: 1.25 MG | 28 days supply | Qty: 8 | Fill #2

## 2017-03-24 ENCOUNTER — Telehealth: Payer: Self-pay | Admitting: Obstetrics and Gynecology

## 2017-03-24 ENCOUNTER — Other Ambulatory Visit (HOSPITAL_COMMUNITY)
Admission: RE | Admit: 2017-03-24 | Discharge: 2017-03-24 | Disposition: A | Discharge status: Home / Self Care | Payer: Self-pay | Source: Other Acute Inpatient Hospital | Attending: Obstetrics and Gynecology | Admitting: Obstetrics and Gynecology

## 2017-03-24 DIAGNOSIS — Z319 Encounter for procreative management, unspecified: Secondary | ICD-10-CM | POA: Insufficient documentation

## 2017-03-24 NOTE — Telephone Encounter (Signed)
Patient states she saw Dr Glo Herring at the hospital and said she could have progesterone drawn just needed to call for the order to be placed. Order entered.

## 2017-03-26 LAB — PROGESTERONE: PROGESTERONE: 7.2 ng/mL

## 2017-03-31 MED FILL — lamoTRIgine 25 MG TABS: 25 | 30 days supply | Qty: 60 | Fill #0

## 2017-03-31 MED FILL — ATENOLOL 25 MG TABLET: 25 | 90 days supply | Qty: 90 | Fill #1

## 2017-03-31 MED FILL — SIMVASTATIN 20 MG TABLET: 20 | 90 days supply | Qty: 90 | Fill #2

## 2017-04-02 ENCOUNTER — Telehealth: Payer: Self-pay | Admitting: Obstetrics and Gynecology

## 2017-04-02 NOTE — Telephone Encounter (Signed)
Patient is beginning to feel actually her period is about, on. I told her that she probably ovulated this month. Since she's had surgery in the past on her tubes and there is uncertain as to whether the tubes are blocked and she will let us know when she begins to have her menstrual period. She wants to have a sonohysterogram which we would schedule about day 7 after her menses

## 2017-04-07 ENCOUNTER — Telehealth: Payer: Self-pay | Admitting: Obstetrics and Gynecology

## 2017-04-08 MED FILL — ESCITALOPRAM 20 MG TABLET: 20 | 90 days supply | Qty: 90 | Fill #1

## 2017-04-16 MED FILL — CYCLOBENZAPRINE 10 MG TAB: 10 | 90 days supply | Qty: 90 | Fill #2

## 2017-04-16 MED FILL — VIT D2 1.25 MG (50,000 UNIT: 1.25 MG | 28 days supply | Qty: 8 | Fill #3

## 2017-04-17 DIAGNOSIS — Z882 Allergy status to sulfonamides status: Secondary | ICD-10-CM | POA: Diagnosis not present

## 2017-04-17 DIAGNOSIS — I10 Essential (primary) hypertension: Secondary | ICD-10-CM | POA: Diagnosis not present

## 2017-04-17 DIAGNOSIS — M5442 Lumbago with sciatica, left side: Secondary | ICD-10-CM | POA: Diagnosis not present

## 2017-04-17 DIAGNOSIS — F419 Anxiety disorder, unspecified: Secondary | ICD-10-CM | POA: Diagnosis not present

## 2017-04-24 DIAGNOSIS — F319 Bipolar disorder, unspecified: Secondary | ICD-10-CM | POA: Diagnosis not present

## 2017-04-24 DIAGNOSIS — Z713 Dietary counseling and surveillance: Secondary | ICD-10-CM | POA: Diagnosis not present

## 2017-04-24 DIAGNOSIS — F4310 Post-traumatic stress disorder, unspecified: Secondary | ICD-10-CM | POA: Diagnosis not present

## 2017-04-24 DIAGNOSIS — F410 Panic disorder [episodic paroxysmal anxiety] without agoraphobia: Secondary | ICD-10-CM | POA: Diagnosis not present

## 2017-04-24 DIAGNOSIS — M549 Dorsalgia, unspecified: Secondary | ICD-10-CM | POA: Diagnosis not present

## 2017-04-24 DIAGNOSIS — Z6830 Body mass index (BMI) 30.0-30.9, adult: Secondary | ICD-10-CM | POA: Diagnosis not present

## 2017-04-25 DIAGNOSIS — M5416 Radiculopathy, lumbar region: Secondary | ICD-10-CM | POA: Diagnosis not present

## 2017-04-28 MED FILL — PHENTERMINE 37.5 MG TABLET: 37.5 | 30 days supply | Qty: 30 | Fill #0 | Status: TO

## 2017-04-28 MED FILL — raNITIdine HCL 300 MG TABS: 300 | 90 days supply | Qty: 90 | Fill #0

## 2017-04-29 DIAGNOSIS — M5416 Radiculopathy, lumbar region: Secondary | ICD-10-CM | POA: Diagnosis not present

## 2017-04-30 MED FILL — PANTOPRAZOLE SOD DR 40 MG T: 40 | 90 days supply | Qty: 90 | Fill #0

## 2017-05-05 MED FILL — lamoTRIgine 25 MG TABS: 25 | 30 days supply | Qty: 60 | Fill #1

## 2017-05-13 DIAGNOSIS — N390 Urinary tract infection, site not specified: Secondary | ICD-10-CM | POA: Diagnosis not present

## 2017-05-19 MED FILL — VIT D2 1.25 MG (50,000 UNIT: 1.25 MG | 28 days supply | Qty: 8 | Fill #4

## 2017-05-20 NOTE — Telephone Encounter (Signed)
Mikia is anticipating  Her next menstr period will ocOccur around 05/25/2017. The plan was to perform an HSG to see she has tubal blockage and that's tentatively scheduled for 06/04/2017. States he is to call our office when she begins her period so we can make sure that the timing of the [facility] is appropriates ,would like to [facility] around  cycle day 7. Patient reports that she is reviewed her record from when she worked in [place], [place] and that there was indication of tubal damage at that point. Have encouragedStacy that she should be working with a productive endocrinology specialist that she may be better served by IVF work instead of attempting use her own fallopian tube, if they've been diagnosed as scarred down I have offered again to refer her to reproductive endocrinologist Plan 1 Stacy call us when she begins her period to confirms timing of [facility] 2.We will encourage Karita to be seen by reproductive endocrinologist if she strongly desires pregnancy

## 2017-06-04 ENCOUNTER — Other Ambulatory Visit: Payer: 59

## 2017-06-10 MED FILL — LISINOPRIL 20 MG TABLET: 20 | 90 days supply | Qty: 90 | Fill #0

## 2017-06-13 DIAGNOSIS — M5416 Radiculopathy, lumbar region: Secondary | ICD-10-CM | POA: Diagnosis not present

## 2017-06-16 ENCOUNTER — Other Ambulatory Visit (HOSPITAL_COMMUNITY): Payer: Self-pay | Admitting: Orthopedic Surgery

## 2017-06-16 ENCOUNTER — Ambulatory Visit (HOSPITAL_COMMUNITY)
Admission: RE | Admit: 2017-06-16 | Discharge: 2017-06-16 | Disposition: A | Discharge status: Home / Self Care | Payer: 59 | Source: Ambulatory Visit | Attending: Orthopedic Surgery | Admitting: Orthopedic Surgery

## 2017-06-16 DIAGNOSIS — M4186 Other forms of scoliosis, lumbar region: Secondary | ICD-10-CM | POA: Diagnosis not present

## 2017-06-16 DIAGNOSIS — M5416 Radiculopathy, lumbar region: Secondary | ICD-10-CM | POA: Diagnosis not present

## 2017-06-16 DIAGNOSIS — M5116 Intervertebral disc disorders with radiculopathy, lumbar region: Secondary | ICD-10-CM | POA: Diagnosis not present

## 2017-06-16 DIAGNOSIS — M5126 Other intervertebral disc displacement, lumbar region: Secondary | ICD-10-CM | POA: Diagnosis not present

## 2017-06-16 DIAGNOSIS — M5137 Other intervertebral disc degeneration, lumbosacral region: Secondary | ICD-10-CM | POA: Insufficient documentation

## 2017-06-19 ENCOUNTER — Ambulatory Visit (HOSPITAL_COMMUNITY)
Admission: RE | Admit: 2017-06-19 | Discharge: 2017-06-19 | Disposition: A | Discharge status: Home / Self Care | Payer: Self-pay | Source: Ambulatory Visit | Attending: Orthopedic Surgery | Admitting: Orthopedic Surgery

## 2017-06-19 MED FILL — lamoTRIgine 25 MG TABS: 25 | 15 days supply | Qty: 30 | Fill #0

## 2017-06-20 ENCOUNTER — Other Ambulatory Visit (HOSPITAL_COMMUNITY): Payer: Self-pay | Admitting: Orthopedic Surgery

## 2017-06-20 DIAGNOSIS — M5416 Radiculopathy, lumbar region: Secondary | ICD-10-CM

## 2017-06-25 MED FILL — SIMVASTATIN 20 MG TABLET: 20 | 90 days supply | Qty: 90 | Fill #0

## 2017-06-25 MED FILL — ATENOLOL 25 MG TABLET: 25 | 90 days supply | Qty: 90 | Fill #2

## 2017-06-30 MED FILL — VIT D2 1.25 MG (50,000 UNIT: 1.25 MG | 28 days supply | Qty: 8 | Fill #5

## 2017-06-30 MED FILL — ESCITALOPRAM 20 MG TABLET: 20 | 90 days supply | Qty: 90 | Fill #2

## 2017-07-07 MED FILL — lamoTRIgine 25 MG TABS: 25 | 15 days supply | Qty: 30 | Fill #1

## 2017-07-15 DIAGNOSIS — M5416 Radiculopathy, lumbar region: Secondary | ICD-10-CM | POA: Diagnosis not present

## 2017-07-17 DIAGNOSIS — M542 Cervicalgia: Secondary | ICD-10-CM | POA: Diagnosis not present

## 2017-07-17 DIAGNOSIS — F4310 Post-traumatic stress disorder, unspecified: Secondary | ICD-10-CM | POA: Diagnosis not present

## 2017-07-17 DIAGNOSIS — Z713 Dietary counseling and surveillance: Secondary | ICD-10-CM | POA: Diagnosis not present

## 2017-07-17 DIAGNOSIS — M1990 Unspecified osteoarthritis, unspecified site: Secondary | ICD-10-CM | POA: Diagnosis not present

## 2017-07-17 DIAGNOSIS — M549 Dorsalgia, unspecified: Secondary | ICD-10-CM | POA: Diagnosis not present

## 2017-07-17 DIAGNOSIS — Z6831 Body mass index (BMI) 31.0-31.9, adult: Secondary | ICD-10-CM | POA: Diagnosis not present

## 2017-07-18 MED FILL — raNITIdine HCL 300 MG TABS: 300 | 90 days supply | Qty: 90 | Fill #0

## 2017-07-18 MED FILL — PANTOPRAZOLE SOD DR 40 MG T: 40 | 90 days supply | Qty: 90 | Fill #0

## 2017-07-18 MED FILL — CYCLOBENZAPRINE 10 MG TAB: 10 | 90 days supply | Qty: 90 | Fill #0

## 2017-07-18 MED FILL — PHENTERMINE 37.5 MG TABLET: 37.5 | 30 days supply | Qty: 30 | Fill #0

## 2017-07-22 DIAGNOSIS — M47816 Spondylosis without myelopathy or radiculopathy, lumbar region: Secondary | ICD-10-CM | POA: Diagnosis not present

## 2017-07-22 DIAGNOSIS — M5416 Radiculopathy, lumbar region: Secondary | ICD-10-CM | POA: Diagnosis not present

## 2017-07-23 MED FILL — lamoTRIgine 25 MG TABS: 25 | 90 days supply | Qty: 180 | Fill #0

## 2017-08-11 DIAGNOSIS — M5416 Radiculopathy, lumbar region: Secondary | ICD-10-CM | POA: Diagnosis not present

## 2017-08-18 DIAGNOSIS — M5416 Radiculopathy, lumbar region: Secondary | ICD-10-CM | POA: Diagnosis not present

## 2017-08-18 DIAGNOSIS — M47816 Spondylosis without myelopathy or radiculopathy, lumbar region: Secondary | ICD-10-CM | POA: Diagnosis not present

## 2017-08-29 DIAGNOSIS — M47816 Spondylosis without myelopathy or radiculopathy, lumbar region: Secondary | ICD-10-CM | POA: Diagnosis not present

## 2017-08-29 DIAGNOSIS — M5416 Radiculopathy, lumbar region: Secondary | ICD-10-CM | POA: Diagnosis not present

## 2017-09-11 MED FILL — LISINOPRIL 20 MG TABLET: 20 | 90 days supply | Qty: 90 | Fill #1

## 2017-09-12 DIAGNOSIS — M5416 Radiculopathy, lumbar region: Secondary | ICD-10-CM | POA: Diagnosis not present

## 2017-09-12 DIAGNOSIS — M47816 Spondylosis without myelopathy or radiculopathy, lumbar region: Secondary | ICD-10-CM | POA: Diagnosis not present

## 2017-09-17 DIAGNOSIS — F333 Major depressive disorder, recurrent, severe with psychotic symptoms: Secondary | ICD-10-CM | POA: Diagnosis not present

## 2017-09-18 DIAGNOSIS — F333 Major depressive disorder, recurrent, severe with psychotic symptoms: Secondary | ICD-10-CM | POA: Diagnosis not present

## 2017-09-19 DIAGNOSIS — F333 Major depressive disorder, recurrent, severe with psychotic symptoms: Secondary | ICD-10-CM | POA: Diagnosis not present

## 2017-09-20 DIAGNOSIS — F333 Major depressive disorder, recurrent, severe with psychotic symptoms: Secondary | ICD-10-CM | POA: Diagnosis not present

## 2017-09-21 DIAGNOSIS — F333 Major depressive disorder, recurrent, severe with psychotic symptoms: Secondary | ICD-10-CM | POA: Diagnosis not present

## 2017-09-22 DIAGNOSIS — F333 Major depressive disorder, recurrent, severe with psychotic symptoms: Secondary | ICD-10-CM | POA: Diagnosis not present

## 2017-09-23 DIAGNOSIS — F333 Major depressive disorder, recurrent, severe with psychotic symptoms: Secondary | ICD-10-CM | POA: Diagnosis not present

## 2017-09-24 DIAGNOSIS — F333 Major depressive disorder, recurrent, severe with psychotic symptoms: Secondary | ICD-10-CM | POA: Diagnosis not present

## 2017-09-24 MED FILL — ATENOLOL 25 MG TABLET: 25 | 90 days supply | Qty: 90 | Fill #0

## 2017-09-25 DIAGNOSIS — F333 Major depressive disorder, recurrent, severe with psychotic symptoms: Secondary | ICD-10-CM | POA: Diagnosis not present

## 2017-09-26 DIAGNOSIS — M5416 Radiculopathy, lumbar region: Secondary | ICD-10-CM | POA: Diagnosis not present

## 2017-09-26 DIAGNOSIS — F333 Major depressive disorder, recurrent, severe with psychotic symptoms: Secondary | ICD-10-CM | POA: Diagnosis not present

## 2017-09-27 DIAGNOSIS — F333 Major depressive disorder, recurrent, severe with psychotic symptoms: Secondary | ICD-10-CM | POA: Diagnosis not present

## 2017-09-28 DIAGNOSIS — F333 Major depressive disorder, recurrent, severe with psychotic symptoms: Secondary | ICD-10-CM | POA: Diagnosis not present

## 2017-09-29 DIAGNOSIS — F333 Major depressive disorder, recurrent, severe with psychotic symptoms: Secondary | ICD-10-CM | POA: Diagnosis not present

## 2017-09-29 MED FILL — PANTOPRAZOLE SOD DR 40 MG T: 40 | 90 days supply | Qty: 90 | Fill #1

## 2017-09-30 DIAGNOSIS — F333 Major depressive disorder, recurrent, severe with psychotic symptoms: Secondary | ICD-10-CM | POA: Diagnosis not present

## 2017-09-30 DIAGNOSIS — M5416 Radiculopathy, lumbar region: Secondary | ICD-10-CM | POA: Diagnosis not present

## 2017-09-30 MED FILL — SIMVASTATIN 20 MG TABLET: 20 | 90 days supply | Qty: 90 | Fill #1

## 2017-10-01 DIAGNOSIS — F333 Major depressive disorder, recurrent, severe with psychotic symptoms: Secondary | ICD-10-CM | POA: Diagnosis not present

## 2017-10-02 DIAGNOSIS — F333 Major depressive disorder, recurrent, severe with psychotic symptoms: Secondary | ICD-10-CM | POA: Diagnosis not present

## 2017-10-03 DIAGNOSIS — F333 Major depressive disorder, recurrent, severe with psychotic symptoms: Secondary | ICD-10-CM | POA: Diagnosis not present

## 2017-10-04 DIAGNOSIS — F333 Major depressive disorder, recurrent, severe with psychotic symptoms: Secondary | ICD-10-CM | POA: Diagnosis not present

## 2017-10-05 DIAGNOSIS — F333 Major depressive disorder, recurrent, severe with psychotic symptoms: Secondary | ICD-10-CM | POA: Diagnosis not present

## 2017-10-06 DIAGNOSIS — F333 Major depressive disorder, recurrent, severe with psychotic symptoms: Secondary | ICD-10-CM | POA: Diagnosis not present

## 2017-10-07 DIAGNOSIS — F333 Major depressive disorder, recurrent, severe with psychotic symptoms: Secondary | ICD-10-CM | POA: Diagnosis not present

## 2017-10-07 DIAGNOSIS — M5416 Radiculopathy, lumbar region: Secondary | ICD-10-CM | POA: Diagnosis not present

## 2017-10-07 DIAGNOSIS — M47816 Spondylosis without myelopathy or radiculopathy, lumbar region: Secondary | ICD-10-CM | POA: Diagnosis not present

## 2017-10-09 DIAGNOSIS — F333 Major depressive disorder, recurrent, severe with psychotic symptoms: Secondary | ICD-10-CM | POA: Diagnosis not present

## 2017-10-11 DIAGNOSIS — F333 Major depressive disorder, recurrent, severe with psychotic symptoms: Secondary | ICD-10-CM | POA: Diagnosis not present

## 2017-10-13 DIAGNOSIS — F333 Major depressive disorder, recurrent, severe with psychotic symptoms: Secondary | ICD-10-CM | POA: Diagnosis not present

## 2017-10-14 DIAGNOSIS — F333 Major depressive disorder, recurrent, severe with psychotic symptoms: Secondary | ICD-10-CM | POA: Diagnosis not present

## 2017-10-15 DIAGNOSIS — F333 Major depressive disorder, recurrent, severe with psychotic symptoms: Secondary | ICD-10-CM | POA: Diagnosis not present

## 2017-10-16 DIAGNOSIS — F333 Major depressive disorder, recurrent, severe with psychotic symptoms: Secondary | ICD-10-CM | POA: Diagnosis not present

## 2017-10-17 DIAGNOSIS — F333 Major depressive disorder, recurrent, severe with psychotic symptoms: Secondary | ICD-10-CM | POA: Diagnosis not present

## 2017-10-20 DIAGNOSIS — F333 Major depressive disorder, recurrent, severe with psychotic symptoms: Secondary | ICD-10-CM | POA: Diagnosis not present

## 2017-10-20 MED FILL — PHENTERMINE 37.5 MG TABLET: 37.5 | 30 days supply | Qty: 30 | Fill #1

## 2017-10-20 MED FILL — CYCLOBENZAPRINE 10 MG TAB: 10 | 90 days supply | Qty: 90 | Fill #1

## 2017-10-21 DIAGNOSIS — F333 Major depressive disorder, recurrent, severe with psychotic symptoms: Secondary | ICD-10-CM | POA: Diagnosis not present

## 2017-10-22 DIAGNOSIS — F333 Major depressive disorder, recurrent, severe with psychotic symptoms: Secondary | ICD-10-CM | POA: Diagnosis not present

## 2017-10-23 DIAGNOSIS — F333 Major depressive disorder, recurrent, severe with psychotic symptoms: Secondary | ICD-10-CM | POA: Diagnosis not present

## 2017-10-23 MED FILL — lamoTRIgine 100 MG TABS: 100 | 30 days supply | Qty: 30 | Fill #0

## 2017-10-24 DIAGNOSIS — F333 Major depressive disorder, recurrent, severe with psychotic symptoms: Secondary | ICD-10-CM | POA: Diagnosis not present

## 2017-10-28 DIAGNOSIS — M549 Dorsalgia, unspecified: Secondary | ICD-10-CM | POA: Diagnosis not present

## 2017-10-28 DIAGNOSIS — K5900 Constipation, unspecified: Secondary | ICD-10-CM | POA: Diagnosis not present

## 2017-10-28 DIAGNOSIS — K219 Gastro-esophageal reflux disease without esophagitis: Secondary | ICD-10-CM | POA: Diagnosis not present

## 2017-10-28 DIAGNOSIS — I10 Essential (primary) hypertension: Secondary | ICD-10-CM | POA: Diagnosis not present

## 2017-10-28 DIAGNOSIS — Z882 Allergy status to sulfonamides status: Secondary | ICD-10-CM | POA: Diagnosis not present

## 2017-10-28 DIAGNOSIS — Z713 Dietary counseling and surveillance: Secondary | ICD-10-CM | POA: Diagnosis not present

## 2017-10-28 DIAGNOSIS — K645 Perianal venous thrombosis: Secondary | ICD-10-CM | POA: Diagnosis not present

## 2017-10-28 DIAGNOSIS — E785 Hyperlipidemia, unspecified: Secondary | ICD-10-CM | POA: Diagnosis not present

## 2017-10-28 DIAGNOSIS — Z6831 Body mass index (BMI) 31.0-31.9, adult: Secondary | ICD-10-CM | POA: Diagnosis not present

## 2017-10-28 MED FILL — raNITIdine HCL 300 MG TABS: 300 | 90 days supply | Qty: 90 | Fill #0

## 2017-10-30 DIAGNOSIS — M47817 Spondylosis without myelopathy or radiculopathy, lumbosacral region: Secondary | ICD-10-CM | POA: Diagnosis not present

## 2017-11-09 DIAGNOSIS — F333 Major depressive disorder, recurrent, severe with psychotic symptoms: Secondary | ICD-10-CM | POA: Diagnosis not present

## 2017-11-10 DIAGNOSIS — F333 Major depressive disorder, recurrent, severe with psychotic symptoms: Secondary | ICD-10-CM | POA: Diagnosis not present

## 2017-11-11 DIAGNOSIS — F333 Major depressive disorder, recurrent, severe with psychotic symptoms: Secondary | ICD-10-CM | POA: Diagnosis not present

## 2017-11-12 DIAGNOSIS — M5416 Radiculopathy, lumbar region: Secondary | ICD-10-CM | POA: Diagnosis not present

## 2017-11-12 DIAGNOSIS — M47816 Spondylosis without myelopathy or radiculopathy, lumbar region: Secondary | ICD-10-CM | POA: Diagnosis not present

## 2017-11-12 DIAGNOSIS — M47817 Spondylosis without myelopathy or radiculopathy, lumbosacral region: Secondary | ICD-10-CM | POA: Diagnosis not present

## 2017-11-12 DIAGNOSIS — F333 Major depressive disorder, recurrent, severe with psychotic symptoms: Secondary | ICD-10-CM | POA: Diagnosis not present

## 2017-11-14 DIAGNOSIS — F333 Major depressive disorder, recurrent, severe with psychotic symptoms: Secondary | ICD-10-CM | POA: Diagnosis not present

## 2017-11-16 DIAGNOSIS — F333 Major depressive disorder, recurrent, severe with psychotic symptoms: Secondary | ICD-10-CM | POA: Diagnosis not present

## 2017-11-22 DIAGNOSIS — F333 Major depressive disorder, recurrent, severe with psychotic symptoms: Secondary | ICD-10-CM | POA: Diagnosis not present

## 2017-11-23 DIAGNOSIS — F333 Major depressive disorder, recurrent, severe with psychotic symptoms: Secondary | ICD-10-CM | POA: Diagnosis not present

## 2017-11-24 DIAGNOSIS — F333 Major depressive disorder, recurrent, severe with psychotic symptoms: Secondary | ICD-10-CM | POA: Diagnosis not present

## 2017-11-25 DIAGNOSIS — F333 Major depressive disorder, recurrent, severe with psychotic symptoms: Secondary | ICD-10-CM | POA: Diagnosis not present

## 2017-11-27 DIAGNOSIS — M47817 Spondylosis without myelopathy or radiculopathy, lumbosacral region: Secondary | ICD-10-CM | POA: Diagnosis not present

## 2017-11-27 MED FILL — PHENTERMINE 37.5 MG TABLET: 37.5 | 30 days supply | Qty: 30 | Fill #2

## 2017-11-28 DIAGNOSIS — F333 Major depressive disorder, recurrent, severe with psychotic symptoms: Secondary | ICD-10-CM | POA: Diagnosis not present

## 2017-11-29 DIAGNOSIS — F333 Major depressive disorder, recurrent, severe with psychotic symptoms: Secondary | ICD-10-CM | POA: Diagnosis not present

## 2018-03-18 DIAGNOSIS — M5116 Intervertebral disc disorders with radiculopathy, lumbar region: Secondary | ICD-10-CM | POA: Diagnosis not present

## 2018-04-20 DIAGNOSIS — I10 Essential (primary) hypertension: Secondary | ICD-10-CM | POA: Diagnosis not present

## 2018-04-20 DIAGNOSIS — M5116 Intervertebral disc disorders with radiculopathy, lumbar region: Secondary | ICD-10-CM | POA: Diagnosis not present

## 2018-04-20 DIAGNOSIS — M5117 Intervertebral disc disorders with radiculopathy, lumbosacral region: Secondary | ICD-10-CM | POA: Diagnosis not present

## 2018-04-26 DIAGNOSIS — Z9889 Other specified postprocedural states: Secondary | ICD-10-CM | POA: Diagnosis not present

## 2018-04-26 DIAGNOSIS — I10 Essential (primary) hypertension: Secondary | ICD-10-CM | POA: Diagnosis not present

## 2018-04-26 DIAGNOSIS — K5900 Constipation, unspecified: Secondary | ICD-10-CM | POA: Diagnosis not present

## 2018-08-09 IMAGING — MR MR LUMBAR SPINE W/O CM
4 of 5 series · 15 of 48 positions shown · non-contrast
Comparison: CT 05/22/2015

CLINICAL DATA: Low back pain radiating down both legs over the last
3 weeks.

EXAM:
MRI LUMBAR SPINE WITHOUT CONTRAST
TECHNIQUE: Multiplanar, multisequence MR imaging of the lumbar spine was
performed. No intravenous contrast was administered.

[Series 3: T2 · sagittal · 4.0mm · 0.78mm/px · 6 of 15 slices shown (1 of 2)]
[im 1/15]
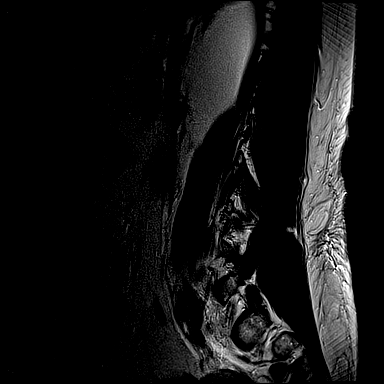
[im 3/15]
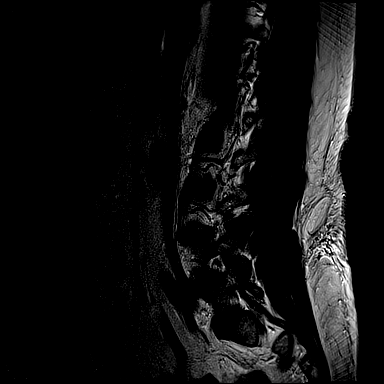
[im 6/15]
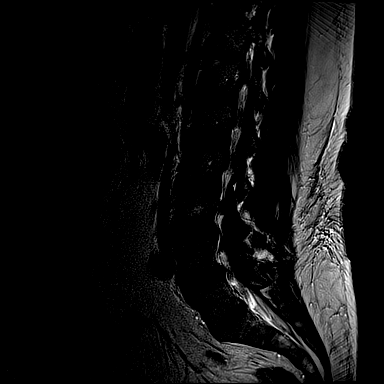
[im 9/15]
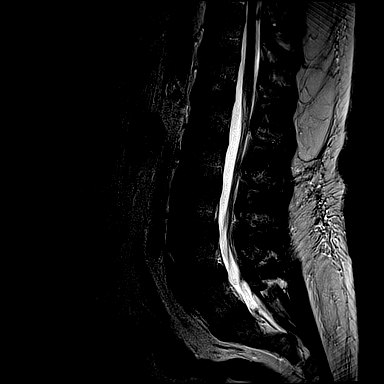
[im 12/15]
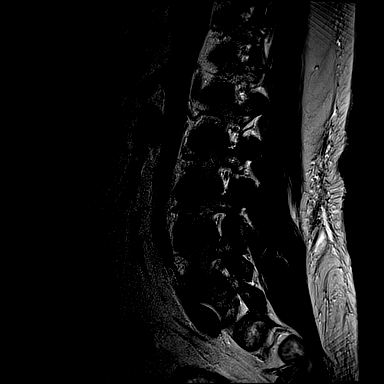
[im 15/15]
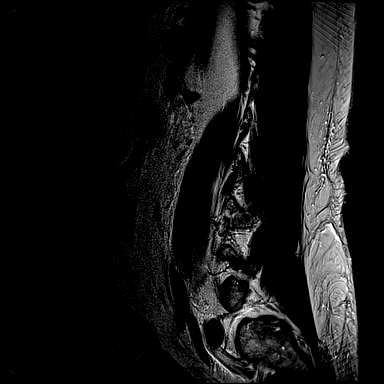

[Series 4: T1 · sagittal · 4.0mm · 0.39mm/px · 3 of 15 slices shown (1 of 2)]
[im 3/15]
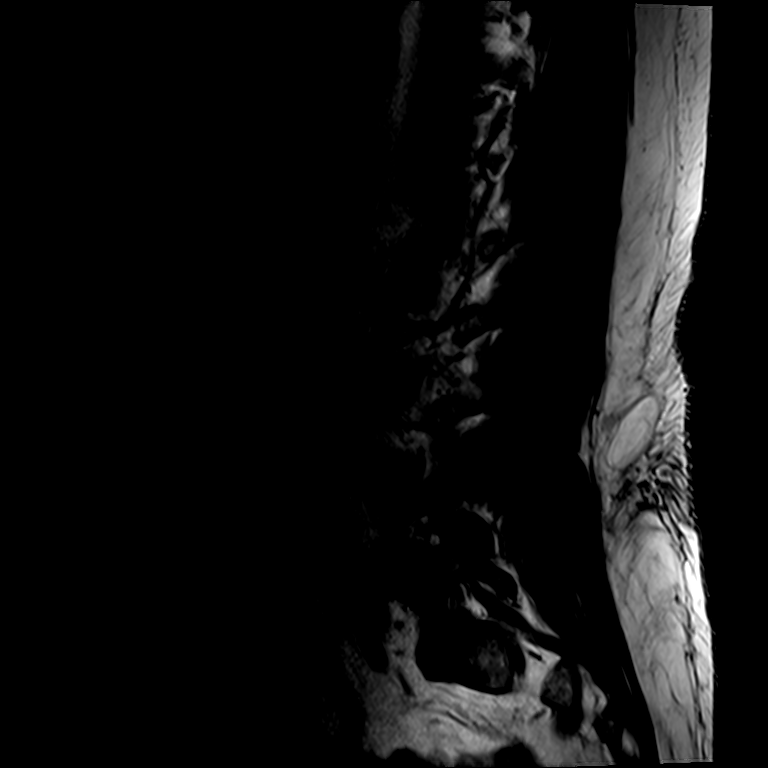
[im 9/15]
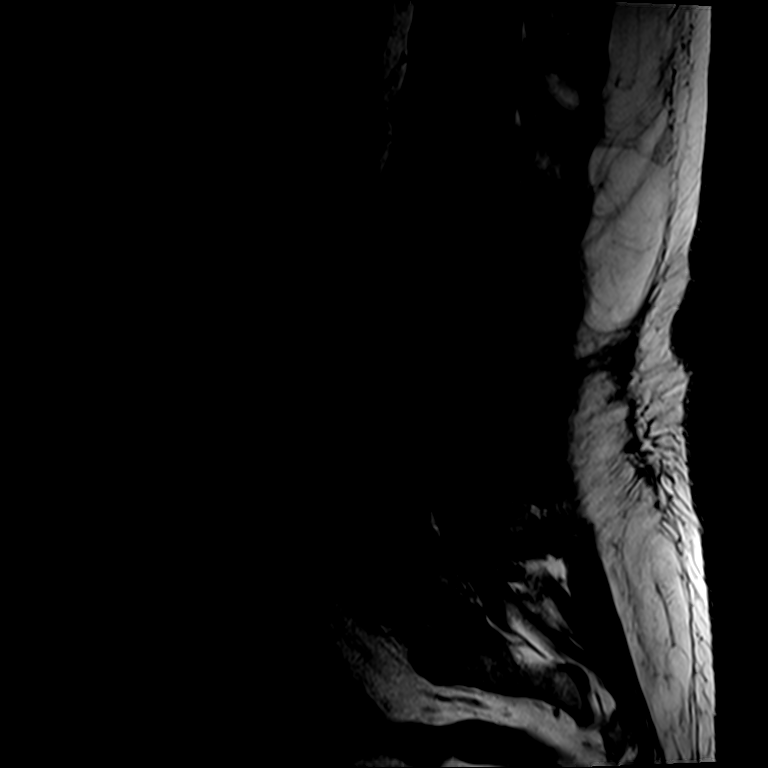
[im 15/15]
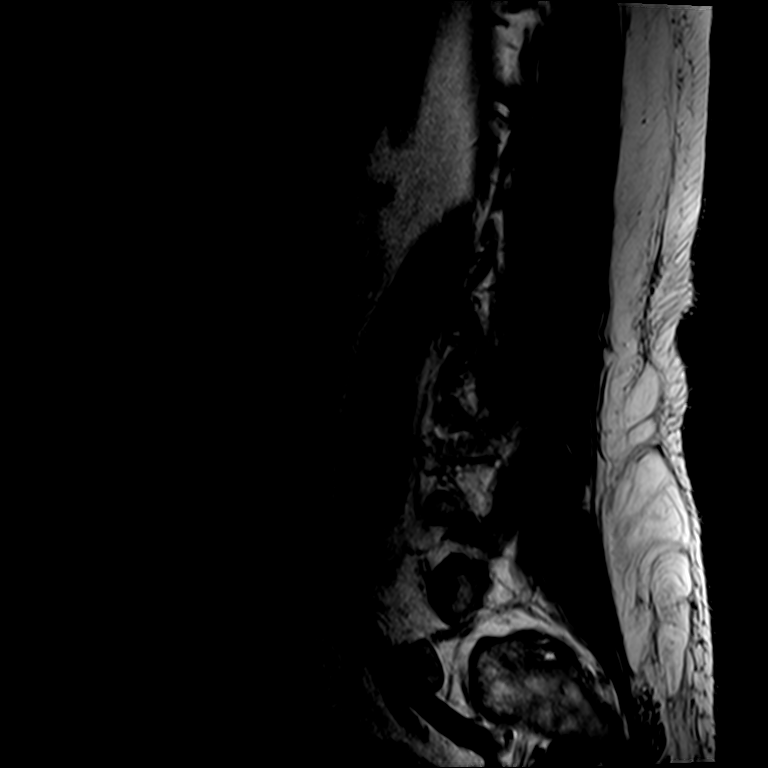

[Series 6: T2 · axial · 4.0mm · 0.28mm/px · z∈[-62,+63]mm · 3 of 37 slices shown (2 of 2)]
[im 6/37]
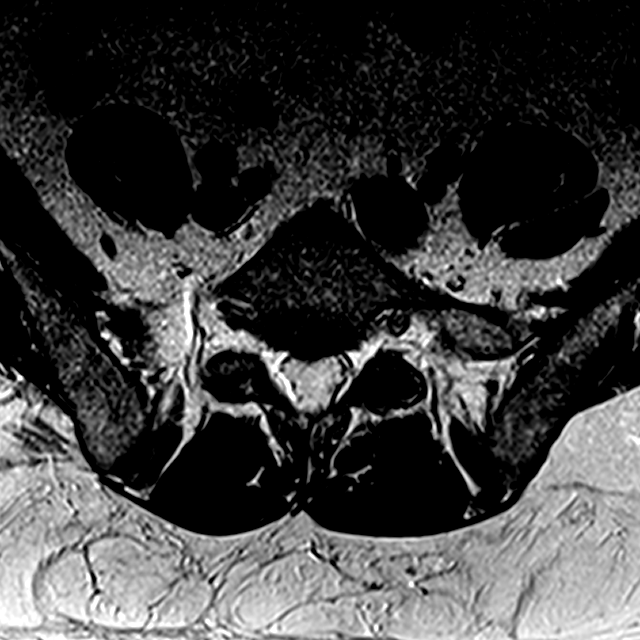
[im 19/37]
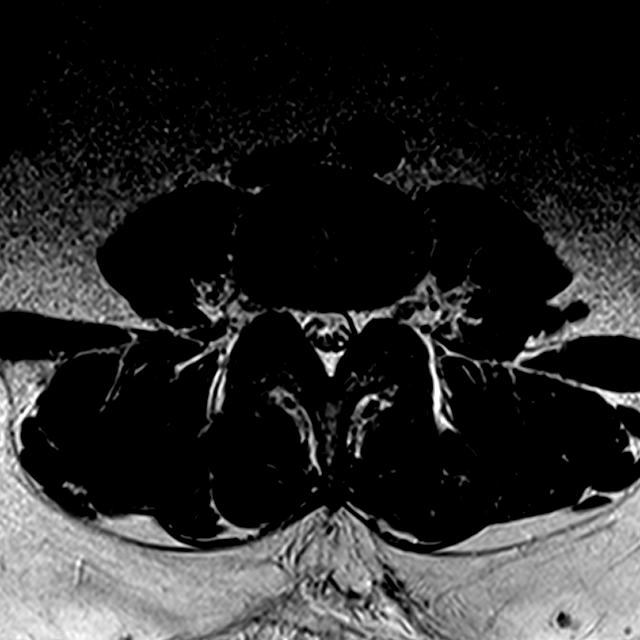
[im 31/37]
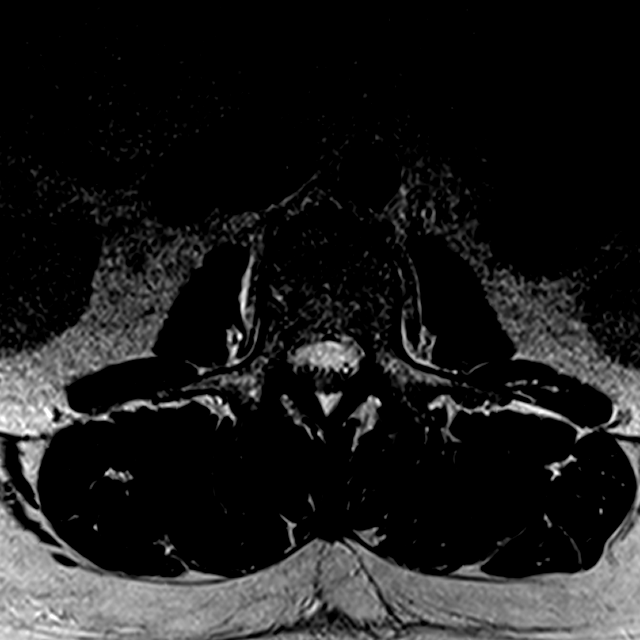

[Series 7: T1 · axial · 4.0mm · 0.28mm/px · z∈[-62,+63]mm · 3 of 37 slices shown (2 of 2)]
[im 6/37]
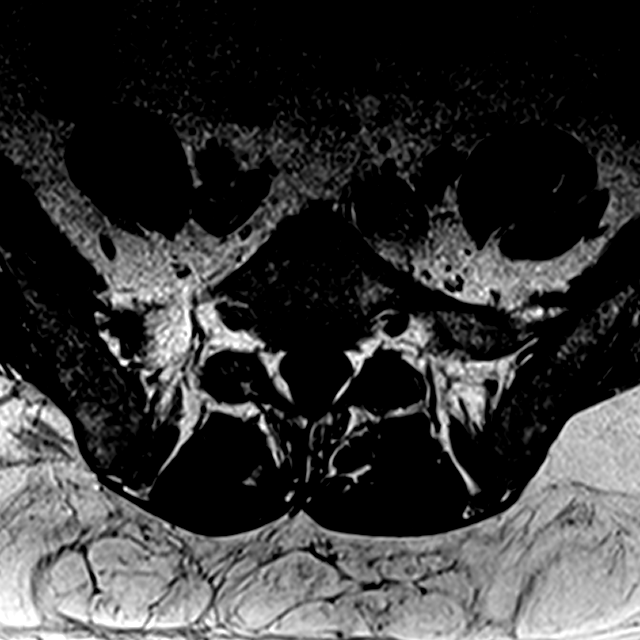
[im 19/37]
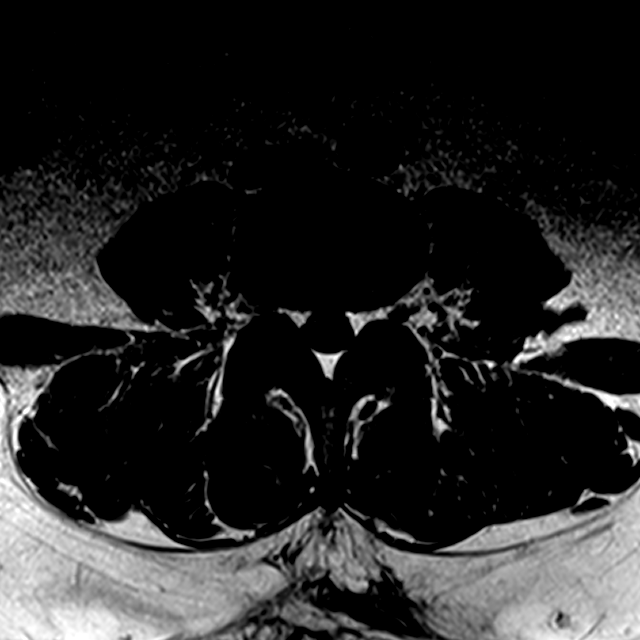
[im 31/37]
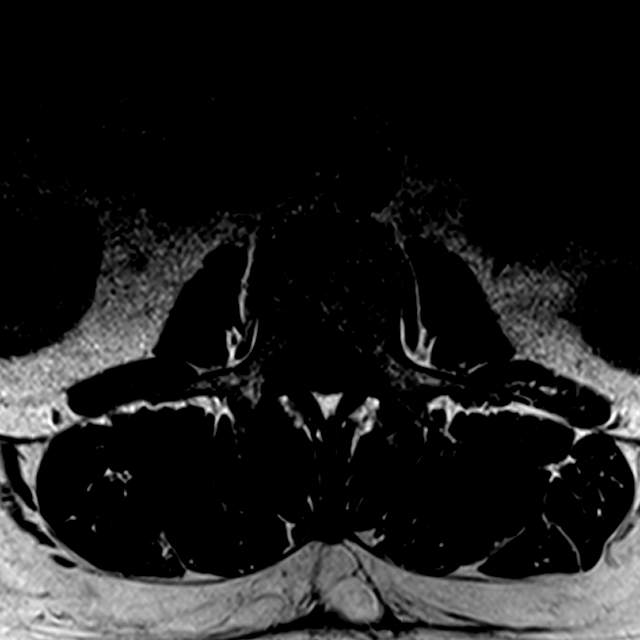

[15 of 48 positions shown; findings below may reference images not displayed]

FINDINGS: Segmentation:  S1 is a transitional vertebra.

Alignment:  Normal

Vertebrae:  No fracture or primary bone lesion.

Conus medullaris and cauda equina: Conus extends to the L2 level.
Conus and cauda equina appear normal.

Paraspinal and other soft tissues: Normal

Disc levels:

No abnormality at L3-4 or above.

L4-5: Mild bulging of the disc. Small right extraforaminal disc
protrusion could focally affect the right L4 nerve. No canal
stenosis. Facet degeneration is present which could be associated
with back pain or referred pain.

L5-S1: Disc degeneration with annular fissures an annular bulging.
Bilateral facet degeneration. No compressive stenosis.

S1-2:  Rudimentary and normal.
IMPRESSION: S1 is a transitional vertebra.

L4-5: Mild disc bulge. Small right extraforaminal protrusion which
could focally irritate the right L4 nerve. The disc bulge and facet
degeneration could be associated with back pain or referred facet
syndrome pain.

L5-S1: Disc degeneration with annular fissures and annular bulging.
Facet degeneration and hypertrophy. No compressive stenosis.
Findings could be associated with back pain or referred facet
syndrome pain.

## 2020-05-08 DIAGNOSIS — F3160 Bipolar disorder, current episode mixed, unspecified: Secondary | ICD-10-CM | POA: Diagnosis not present

## 2020-05-08 DIAGNOSIS — F331 Major depressive disorder, recurrent, moderate: Secondary | ICD-10-CM | POA: Diagnosis not present

## 2020-05-08 DIAGNOSIS — F419 Anxiety disorder, unspecified: Secondary | ICD-10-CM | POA: Diagnosis not present

## 2020-06-05 DIAGNOSIS — F3160 Bipolar disorder, current episode mixed, unspecified: Secondary | ICD-10-CM | POA: Diagnosis not present

## 2020-06-05 DIAGNOSIS — F331 Major depressive disorder, recurrent, moderate: Secondary | ICD-10-CM | POA: Diagnosis not present

## 2020-06-05 DIAGNOSIS — F419 Anxiety disorder, unspecified: Secondary | ICD-10-CM | POA: Diagnosis not present

## 2020-06-22 DIAGNOSIS — Z20822 Contact with and (suspected) exposure to covid-19: Secondary | ICD-10-CM | POA: Diagnosis not present

## 2020-07-03 DIAGNOSIS — F3160 Bipolar disorder, current episode mixed, unspecified: Secondary | ICD-10-CM | POA: Diagnosis not present

## 2020-07-03 DIAGNOSIS — F331 Major depressive disorder, recurrent, moderate: Secondary | ICD-10-CM | POA: Diagnosis not present

## 2020-07-03 DIAGNOSIS — F419 Anxiety disorder, unspecified: Secondary | ICD-10-CM | POA: Diagnosis not present

## 2020-08-03 DIAGNOSIS — F331 Major depressive disorder, recurrent, moderate: Secondary | ICD-10-CM | POA: Diagnosis not present

## 2020-08-03 DIAGNOSIS — F3160 Bipolar disorder, current episode mixed, unspecified: Secondary | ICD-10-CM | POA: Diagnosis not present

## 2020-08-03 DIAGNOSIS — F419 Anxiety disorder, unspecified: Secondary | ICD-10-CM | POA: Diagnosis not present

## 2020-10-09 DIAGNOSIS — F419 Anxiety disorder, unspecified: Secondary | ICD-10-CM | POA: Diagnosis not present

## 2020-10-09 DIAGNOSIS — F3160 Bipolar disorder, current episode mixed, unspecified: Secondary | ICD-10-CM | POA: Diagnosis not present

## 2020-10-09 DIAGNOSIS — F331 Major depressive disorder, recurrent, moderate: Secondary | ICD-10-CM | POA: Diagnosis not present

## 2020-11-23 DIAGNOSIS — F331 Major depressive disorder, recurrent, moderate: Secondary | ICD-10-CM | POA: Diagnosis not present

## 2020-11-23 DIAGNOSIS — F3160 Bipolar disorder, current episode mixed, unspecified: Secondary | ICD-10-CM | POA: Diagnosis not present

## 2020-11-23 DIAGNOSIS — F419 Anxiety disorder, unspecified: Secondary | ICD-10-CM | POA: Diagnosis not present

## 2021-01-09 ENCOUNTER — Emergency Department (HOSPITAL_COMMUNITY)
Admission: EM | Admit: 2021-01-09 | Discharge: 2021-01-09 | Disposition: A | Discharge status: Home / Self Care | Payer: Medicaid - Out of State | Attending: Emergency Medicine | Admitting: Emergency Medicine

## 2021-01-09 ENCOUNTER — Other Ambulatory Visit: Payer: Self-pay

## 2021-01-09 ENCOUNTER — Encounter (HOSPITAL_COMMUNITY): Payer: Self-pay | Admitting: Emergency Medicine

## 2021-01-09 DIAGNOSIS — Z79899 Other long term (current) drug therapy: Secondary | ICD-10-CM | POA: Insufficient documentation

## 2021-01-09 DIAGNOSIS — M5442 Lumbago with sciatica, left side: Secondary | ICD-10-CM | POA: Diagnosis not present

## 2021-01-09 DIAGNOSIS — F17210 Nicotine dependence, cigarettes, uncomplicated: Secondary | ICD-10-CM | POA: Insufficient documentation

## 2021-01-09 DIAGNOSIS — I10 Essential (primary) hypertension: Secondary | ICD-10-CM | POA: Diagnosis not present

## 2021-01-09 DIAGNOSIS — M5441 Lumbago with sciatica, right side: Secondary | ICD-10-CM

## 2021-01-09 DIAGNOSIS — M545 Low back pain, unspecified: Secondary | ICD-10-CM | POA: Diagnosis present

## 2021-01-09 DIAGNOSIS — M533 Sacrococcygeal disorders, not elsewhere classified: Secondary | ICD-10-CM | POA: Diagnosis not present

## 2021-01-09 LAB — BASIC METABOLIC PANEL
Anion gap: 7 (ref 5–15)
BUN: 14 mg/dL (ref 6–20)
CO2: 23 mmol/L (ref 22–32)
Calcium: 9 mg/dL (ref 8.9–10.3)
Chloride: 105 mmol/L (ref 98–111)
Creatinine, Ser: 0.69 mg/dL (ref 0.44–1.00)
GFR, Estimated: >60 mL/min (ref >60)
Glucose, Bld: 95 mg/dL (ref 70–99)
Potassium: 3.6 mmol/L (ref 3.5–5.1)
Sodium: 135 mmol/L (ref 135–145)

## 2021-01-09 LAB — CBC WITH DIFFERENTIAL/PLATELET
Abs Immature Granulocytes: 0.05 10*3/uL (ref 0.00–0.07)
Basophils Absolute: 0 10*3/uL (ref 0.0–0.1)
Basophils Relative: 0 %
Eosinophils Absolute: 0.1 10*3/uL (ref 0.0–0.5)
Eosinophils Relative: 1 %
HCT: 38.3 % (ref 36.0–46.0)
Hemoglobin: 13.1 g/dL (ref 12.0–15.0)
Immature Granulocytes: 0 %
Lymphocytes Relative: 28 %
Lymphs Abs: 3.4 10*3/uL (ref 0.7–4.0)
MCH: 30.9 pg (ref 26.0–34.0)
MCHC: 34.2 g/dL (ref 30.0–36.0)
MCV: 90.3 fL (ref 80.0–100.0)
Monocytes Absolute: 0.9 10*3/uL (ref 0.1–1.0)
Monocytes Relative: 8 %
Neutro Abs: 7.4 10*3/uL (ref 1.7–7.7)
Neutrophils Relative %: 63 %
Platelets: 312 10*3/uL (ref 150–400)
RBC: 4.24 MIL/uL (ref 3.87–5.11)
RDW: 12.7 % (ref 11.5–15.5)
WBC: 11.9 10*3/uL — ABNORMAL HIGH (ref 4.0–10.5)
nRBC: 0 % (ref 0.0–0.2)

## 2021-01-09 LAB — URINALYSIS, ROUTINE W REFLEX MICROSCOPIC
Bacteria, UA: NONE SEEN
Bilirubin Urine: NEGATIVE
Glucose, UA: NEGATIVE mg/dL
Ketones, ur: NEGATIVE mg/dL
Leukocytes,Ua: NEGATIVE
Nitrite: NEGATIVE
Protein, ur: NEGATIVE mg/dL
Specific Gravity, Urine: 1.011 (ref 1.005–1.030)
pH: 6 (ref 5.0–8.0)

## 2021-01-09 LAB — PREGNANCY, URINE: Preg Test, Ur: NEGATIVE

## 2021-01-09 MED ORDER — METHOCARBAMOL 500 MG PO TABS: 500.0000 mg | ORAL_TABLET | Freq: Three times a day (TID) | ORAL | 0 refills | Status: AC

## 2021-01-09 MED ORDER — PREDNISONE 10 MG PO TABS: ORAL_TABLET | ORAL | 0 refills | Status: AC

## 2021-01-09 MED ORDER — HYDROCODONE-ACETAMINOPHEN 5-325 MG PO TABS: ORAL_TABLET | ORAL | 0 refills | Status: AC

## 2021-01-09 MED ORDER — HYDROCODONE-ACETAMINOPHEN 5-325 MG PO TABS
1.0000 | ORAL_TABLET | Freq: Once | ORAL | Status: AC
Start: 2021-01-09 — End: 2021-01-09
  Administered 2021-01-09: 1 via ORAL
  Filled 2021-01-09: qty 1

## 2021-01-09 NOTE — ED Provider Notes (Signed)
Megan Sweeney Provider Note   CSN: [id number] Arrival date & time: 01/09/21  1612     History Chief Complaint  Patient presents with   Back Pain    Megan Sweeney is a 45 y.o. female.   Back Pain Associated symptoms: no abdominal pain, no dysuria, no fever, no headaches, no numbness and no weakness     Megan Sweeney is a 45 y.o. female who presents to the Emergency Department complaining of bilateral low back pain and buttock pain.  Pain has been gradually worsening for 1 week.  No known injury.  She describes a dull pain that becomes intense and stabbing with certain movements.  Pain improves at rest.  She has history of lumbar fusion of L3-4 and 5.  States that she works in a [employer] and is on her feet all day.  She denies fever, chills, abdominal pain, urine or bowel changes, numbness or weakness of her lower legs.  States that she attempted to get in with her spine doctor, but he is out of town and will not be back until next week.    Past Medical History:  Diagnosis Date   Anxiety    Back pain    Cat scratch fever    Hydrosalpinx    Hypertension    Infertility, female    Kidney stones    Mental disorder    anxiety   Ovarian cyst, right    Reflux     Patient Active Problem List   Diagnosis Date Noted   BV (bacterial vaginosis) 09/05/2016   Pelvic pain in female 08/21/2016    Past Surgical History:  Procedure Laterality Date   ABDOMINAL SURGERY     LITHOTRIPSY       OB History   No obstetric history on file.     Family History  Problem Relation Age of Onset   Cancer Paternal Grandmother        ovarian   Cancer Maternal Grandmother        ovarian    Social History   Tobacco Use   Smoking status: Every Day    Packs/day: 0.25    Pack years: 0.00    Types: Cigarettes   Smokeless tobacco: Never  Substance Use Topics   Alcohol use: No   Drug use: No    Home Medications Prior to Admission medications   Medication  Sig Start Date End Date Taking? Authorizing Provider  HYDROcodone-acetaminophen (NORCO/VICODIN) 5-325 MG tablet Take one tab po q 4 hrs prn pain 01/09/21  Yes Murrel Bertram, PA-C  methocarbamol (ROBAXIN) 500 MG tablet Take 1 tablet (500 mg total) by mouth 3 (three) times daily. 01/09/21  Yes Ayrton Mcvay, PA-C  predniSONE (DELTASONE) 10 MG tablet Take 6 tablets day one, 5 tablets day two, 4 tablets day three, 3 tablets day four, 2 tablets day five, then 1 tablet day six 01/09/21  Yes Natallie Ravenscroft, PA-C  ALPRAZolam (XANAX) 1 MG tablet Take 1 mg by mouth at bedtime as needed for anxiety.    [provider]  atenolol (TENORMIN) 25 MG tablet Take 25 mg by mouth daily.    [provider]  doxycycline (VIBRAMYCIN) 100 MG capsule Take 1 capsule (100 mg total) by mouth 2 (two) times daily. Patient not taking: Reported on 08/21/2016 07/31/16   Jonnie Kind, MD  escitalopram (LEXAPRO) 20 MG tablet Take 20 mg by mouth daily.    [provider]  esomeprazole (NEXIUM) 40 MG capsule Take 40  mg by mouth daily at 12 noon.    [provider]  ibuprofen (ADVIL,MOTRIN) 200 MG tablet Take 800 mg by mouth every 6 (six) hours as needed for moderate pain.    [provider]  lisinopril (PRINIVIL,ZESTRIL) 10 MG tablet Take 20 mg by mouth daily.     [provider]  Omega-3 Fatty Acids (FISH OIL BURP-LESS PO) Take 1 capsule by mouth daily.    [provider]  promethazine (PHENERGAN) 25 MG tablet Take 1 tablet (25 mg total) by mouth every 6 (six) hours as needed. 05/22/15   Fredia Sorrow, MD  simvastatin (ZOCOR) 20 MG tablet Take 20 mg by mouth daily.    [provider]    Allergies    Sulfa antibiotics  Review of Systems   Review of Systems  Constitutional:  Negative for appetite change, chills and fever.  Respiratory:  Negative for shortness of breath.   Gastrointestinal:  Negative for abdominal pain, constipation, nausea and  vomiting.  Genitourinary:  Negative for decreased urine volume, difficulty urinating, dysuria, flank pain and hematuria.  Musculoskeletal:  Positive for back pain. Negative for joint swelling.  Skin:  Negative for rash.  Neurological:  Negative for dizziness, weakness, numbness and headaches.   Physical Exam Updated Vital Signs BP (!) 120/93 (BP Location: Right Arm)   Pulse (!) 113   Temp 98.1 F (36.7 C) (Oral)   Resp 16   Ht 5\' 9"  (1.753 m)   Wt 98.9 kg   SpO2 98%   BMI 32.19 kg/m   Physical Exam Constitutional:      Appearance: Normal appearance.  HENT:     Head: Normocephalic.  Eyes:     Pupils: Pupils are equal, round, and reactive to light.  Cardiovascular:     Rate and Rhythm: Normal rate and regular rhythm.     Pulses: Normal pulses.  Pulmonary:     Effort: Pulmonary effort is normal.     Breath sounds: Normal breath sounds. No wheezing.  Abdominal:     Palpations: Abdomen is soft.     Tenderness: There is no abdominal tenderness. There is no guarding or rebound.  Musculoskeletal:        General: Tenderness present. No swelling or signs of injury. Normal range of motion.     Cervical back: Normal range of motion.     Comments: Diffuse tenderness palpation of the bilateral lumbar paraspinal muscles.  5 out of 5 strength of the bilateral lower extremities.  Hip flexors and extensors are intact.  Skin:    General: Skin is warm.     Capillary Refill: Capillary refill takes less than 2 seconds.     Findings: No rash.  Neurological:     Mental Status: She is alert and oriented to person, place, and time.     Sensory: No sensory deficit.     Motor: No weakness.    ED Results / Procedures / Treatments   Labs (all labs ordered are listed, but only abnormal results are displayed) Labs Reviewed  URINALYSIS, ROUTINE W REFLEX MICROSCOPIC - Abnormal; Notable for the following components:      Result Value   Color, Urine STRAW (*)    Hgb urine dipstick MODERATE (*)     All other components within normal limits  CBC WITH DIFFERENTIAL/PLATELET - Abnormal; Notable for the following components:   WBC 11.9 (*)    All other components within normal limits  PREGNANCY, URINE  BASIC METABOLIC PANEL  EKG None  Radiology No results found.  Procedures Procedures   Medications Ordered in ED Medications  HYDROcodone-acetaminophen (NORCO/VICODIN) 5-325 MG per tablet 1 tablet (1 tablet Oral Given 01/09/21 1845)    ED Course  I have reviewed the triage vital signs and the nursing notes.  Pertinent labs & imaging results that were available during my care of the patient were reviewed by me and considered in my medical decision making (see chart for details).    MDM Rules/Calculators/A&P                          Patient here with likely acute on chronic low back pain.  Has history of the same.  2 prior lumbar fusion surgeries.  No known recent injury to suggest need for imaging today.  On exam, she has tenderness to the bilateral lumbar paraspinal muscles.  No CVA tenderness or abdominal pain.  She is ambulatory with steady gait.  No focal neurodeficits.  No concerning symptoms for emergent process.  No recent spinal procedures to suggest spinal abscess.  Labs and urinalysis unremarkable. Patient is agreeable to treatment with short course of pain medication, muscle relaxer and steroid taper.  She plans to follow-up with her spine specialist next week.  Return precautions were discussed.   Final Clinical Impression(s) / ED Diagnoses Final diagnoses:  Acute bilateral low back pain with bilateral sciatica    Rx / DC Orders ED Discharge Orders          Ordered    predniSONE (DELTASONE) 10 MG tablet        01/09/21 1952    methocarbamol (ROBAXIN) 500 MG tablet  3 times daily        01/09/21 1952    HYDROcodone-acetaminophen (NORCO/VICODIN) 5-325 MG tablet        01/09/21 1952             Kem Parkinson, PA-C 01/09/21 2003    Varney Biles, MD 01/10/21 1605

## 2021-01-09 NOTE — ED Triage Notes (Signed)
Pt c/o lower back pain for the past week. Pt states hx of back fusion with L3, L4, L5.

## 2021-01-09 NOTE — Discharge Instructions (Addendum)
Take the medication as directed.  Follow-up with your spine specialist for recheck.  Return to the emergency department for any new or worsening symptoms.

## 2021-04-30 DIAGNOSIS — F3160 Bipolar disorder, current episode mixed, unspecified: Secondary | ICD-10-CM | POA: Diagnosis not present

## 2021-04-30 DIAGNOSIS — F419 Anxiety disorder, unspecified: Secondary | ICD-10-CM | POA: Diagnosis not present

## 2021-04-30 DIAGNOSIS — F331 Major depressive disorder, recurrent, moderate: Secondary | ICD-10-CM | POA: Diagnosis not present
